# Patient Record
Sex: Male | Born: 1985 | Race: White | Hispanic: No | Marital: Single | Smoking: Never smoker
Health system: Southern US, Community
[De-identification: ages and names within clinical notes are randomized; demographics above are authoritative.]

## PROBLEM LIST (undated history)

## (undated) DIAGNOSIS — N200 Calculus of kidney: Secondary | ICD-10-CM

---
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
## 2011-09-15 ENCOUNTER — Emergency Department (HOSPITAL_COMMUNITY)
Admission: EM | Admit: 2011-09-15 | Discharge: 2011-09-15 | Disposition: A | Discharge status: Home / Self Care | Payer: Self-pay | Attending: Emergency Medicine | Admitting: Emergency Medicine

## 2011-09-15 ENCOUNTER — Emergency Department (HOSPITAL_COMMUNITY): Payer: Self-pay

## 2011-09-15 DIAGNOSIS — R112 Nausea with vomiting, unspecified: Secondary | ICD-10-CM | POA: Insufficient documentation

## 2011-09-15 DIAGNOSIS — R109 Unspecified abdominal pain: Secondary | ICD-10-CM | POA: Insufficient documentation

## 2011-09-15 DIAGNOSIS — N200 Calculus of kidney: Secondary | ICD-10-CM | POA: Insufficient documentation

## 2011-09-15 LAB — URINALYSIS, ROUTINE W REFLEX MICROSCOPIC
Bilirubin Urine: NEGATIVE
Ketones, ur: NEGATIVE mg/dL
Nitrite: NEGATIVE
Urobilinogen, UA: 0.2 mg/dL (ref 0.0–1.0)
pH: 7.5 (ref 5.0–8.0)

## 2011-09-15 LAB — POCT I-STAT, CHEM 8
BUN: 11 mg/dL (ref 6–23)
Creatinine, Ser: 1 mg/dL (ref 0.50–1.35)
Sodium: 141 mEq/L (ref 135–145)
TCO2: 25 mmol/L (ref 0–100)

## 2011-09-16 LAB — URINE CULTURE
Colony Count: NO GROWTH
Culture: NO GROWTH

## 2012-04-19 ENCOUNTER — Other Ambulatory Visit: Payer: Self-pay | Admitting: Urology

## 2012-04-19 DIAGNOSIS — N200 Calculus of kidney: Secondary | ICD-10-CM

## 2012-04-24 ENCOUNTER — Other Ambulatory Visit: Payer: Self-pay | Admitting: Radiology

## 2012-04-25 ENCOUNTER — Encounter (HOSPITAL_COMMUNITY): Payer: Self-pay | Admitting: Pharmacy Technician

## 2012-04-30 ENCOUNTER — Encounter (HOSPITAL_COMMUNITY)
Admission: RE | Admit: 2012-04-30 | Discharge: 2012-04-30 | Disposition: A | Discharge status: Home / Self Care | Payer: Self-pay | Source: Ambulatory Visit | Attending: Urology | Admitting: Urology

## 2012-04-30 ENCOUNTER — Encounter (HOSPITAL_COMMUNITY): Payer: Self-pay

## 2012-04-30 HISTORY — DX: Calculus of kidney: N20.0

## 2012-04-30 LAB — CBC
HCT: 43.6 % (ref 39.0–52.0)
Hemoglobin: 14.6 g/dL (ref 13.0–17.0)
WBC: 4.1 10*3/uL (ref 4.0–10.5)

## 2012-04-30 LAB — BASIC METABOLIC PANEL
CO2: 29 mEq/L (ref 19–32)
Chloride: 101 mEq/L (ref 96–112)
Potassium: 3.6 mEq/L (ref 3.5–5.1)
Sodium: 138 mEq/L (ref 135–145)

## 2012-04-30 LAB — URINE MICROSCOPIC-ADD ON

## 2012-04-30 LAB — URINALYSIS, ROUTINE W REFLEX MICROSCOPIC
Bilirubin Urine: NEGATIVE
Ketones, ur: NEGATIVE mg/dL
Nitrite: NEGATIVE
Protein, ur: 100 mg/dL — AB
Specific Gravity, Urine: 1.023 (ref 1.005–1.030)
Urobilinogen, UA: 0.2 mg/dL (ref 0.0–1.0)

## 2012-04-30 LAB — SURGICAL PCR SCREEN
MRSA, PCR: NEGATIVE
Staphylococcus aureus: POSITIVE — AB

## 2012-04-30 LAB — APTT: aPTT: 31 seconds (ref 24–37)

## 2012-04-30 LAB — PROTIME-INR: INR: 1 (ref 0.00–1.49)

## 2012-04-30 NOTE — Patient Instructions (Addendum)
[address]  04/30/2012   Your procedure is scheduled on:05-07-2012    Report to [facility]iology at  [facility] AM.  Call this number if you have problems the morning of surgery: [phone]   Remember:   Do not eat food or drink liquids:After Midnight.  .  Take these medicines the morning of surgery with A SIP OF WATER: claritin, hydrocodone if needed.   Do not wear jewelry or make up.  Do not wear lotions, powders, or perfumes.Do not wear deodorant.    Do not bring valuables to the hospital.  Contacts, dentures or bridgework may not be worn into surgery.  Leave suitcase in the car. After surgery it may be brought to your room.  For patients admitted to the hospital, checkout time is 11:00 AM the day of discharge.     Special Instructions: CHG Shower Use Special Wash: 1/2 bottle night before surgery and 1/2 bottle morning of surgery.neck down avoid private area   Please read over the following fact sheets that you were given: MRSA Information  [facility] pre op nurse phone number [phone], call if needed

## 2012-05-01 ENCOUNTER — Encounter (HOSPITAL_COMMUNITY): Payer: Self-pay | Admitting: Pharmacy Technician

## 2012-05-03 ENCOUNTER — Telehealth (HOSPITAL_COMMUNITY): Payer: Self-pay | Admitting: Urology

## 2012-05-03 ENCOUNTER — Other Ambulatory Visit: Payer: Self-pay | Admitting: Physician Assistant

## 2012-05-03 LAB — URINE CULTURE

## 2012-05-03 NOTE — Pre-Procedure Instructions (Addendum)
Faxed urine culture, urine micro and ua results to dr Brunilda Payor fax concifrmation received and placed on chart, left message with selita brasher also  Concerning results faxed to dr Brunilda Payor.

## 2012-05-06 ENCOUNTER — Other Ambulatory Visit: Payer: Self-pay | Admitting: Radiology

## 2012-05-07 ENCOUNTER — Ambulatory Visit (HOSPITAL_COMMUNITY): Payer: Self-pay

## 2012-05-07 ENCOUNTER — Ambulatory Visit (HOSPITAL_COMMUNITY): Payer: Self-pay | Admitting: Anesthesiology

## 2012-05-07 ENCOUNTER — Ambulatory Visit (HOSPITAL_COMMUNITY)
Admission: RE | Admit: 2012-05-07 | Discharge: 2012-05-08 | Disposition: A | Discharge status: Home / Self Care | Payer: Self-pay | Source: Ambulatory Visit | Attending: Urology | Admitting: Urology

## 2012-05-07 ENCOUNTER — Encounter (HOSPITAL_COMMUNITY)
Admission: RE | Disposition: A | Discharge status: Home / Self Care | Payer: Self-pay | Source: Ambulatory Visit | Attending: Urology

## 2012-05-07 ENCOUNTER — Ambulatory Visit (HOSPITAL_COMMUNITY)
Admission: RE | Admit: 2012-05-07 | Discharge: 2012-05-07 | Disposition: A | Discharge status: Home / Self Care | Payer: Self-pay | Source: Ambulatory Visit | Attending: Urology | Admitting: Urology

## 2012-05-07 ENCOUNTER — Encounter (HOSPITAL_COMMUNITY): Payer: Self-pay | Admitting: Anesthesiology

## 2012-05-07 ENCOUNTER — Encounter (HOSPITAL_COMMUNITY): Payer: Self-pay | Admitting: Urology

## 2012-05-07 DIAGNOSIS — N200 Calculus of kidney: Secondary | ICD-10-CM

## 2012-05-07 DIAGNOSIS — R109 Unspecified abdominal pain: Secondary | ICD-10-CM | POA: Insufficient documentation

## 2012-05-07 DIAGNOSIS — N1330 Unspecified hydronephrosis: Secondary | ICD-10-CM | POA: Insufficient documentation

## 2012-05-07 DIAGNOSIS — Z01812 Encounter for preprocedural laboratory examination: Secondary | ICD-10-CM | POA: Insufficient documentation

## 2012-05-07 HISTORY — PX: NEPHROLITHOTOMY: SHX5134

## 2012-05-07 SURGERY — NEPHROLITHOTOMY PERCUTANEOUS
Anesthesia: General | Laterality: Left | Wound class: Clean Contaminated

## 2012-05-07 MED ORDER — IOHEXOL 300 MG/ML  SOLN
INTRAMUSCULAR | Status: AC
  Filled 2012-05-07: qty 2

## 2012-05-07 MED ORDER — FENTANYL CITRATE 0.05 MG/ML IJ SOLN
INTRAMUSCULAR | Status: AC | PRN
  Administered 2012-05-07 (×3): 50 ug via INTRAVENOUS

## 2012-05-07 MED ORDER — PROPOFOL 10 MG/ML IV EMUL
INTRAVENOUS | Status: DC | PRN
  Administered 2012-05-07: 150 mg via INTRAVENOUS

## 2012-05-07 MED ORDER — SODIUM CHLORIDE 0.9 % IV BOLUS (SEPSIS)
500.0000 mL | Freq: Once | INTRAVENOUS | Status: AC
  Administered 2012-05-07: 500 mL via INTRAVENOUS

## 2012-05-07 MED ORDER — MIDAZOLAM HCL 5 MG/5ML IJ SOLN
INTRAMUSCULAR | Status: AC | PRN
  Administered 2012-05-07 (×3): 2 mg via INTRAVENOUS

## 2012-05-07 MED ORDER — SODIUM CHLORIDE 0.9 % IV SOLN: INTRAVENOUS | Status: DC

## 2012-05-07 MED ORDER — FENTANYL CITRATE 0.05 MG/ML IJ SOLN
INTRAMUSCULAR | Status: AC
  Filled 2012-05-07: qty 2

## 2012-05-07 MED ORDER — SUFENTANIL CITRATE 50 MCG/ML IV SOLN
INTRAVENOUS | Status: DC | PRN
  Administered 2012-05-07 (×5): 10 ug via INTRAVENOUS

## 2012-05-07 MED ORDER — SODIUM CHLORIDE 0.9 % IR SOLN
Status: DC | PRN
  Administered 2012-05-07: 15000 mL

## 2012-05-07 MED ORDER — LACTATED RINGERS IV SOLN
INTRAVENOUS | Status: DC | PRN
  Administered 2012-05-07 (×2): via INTRAVENOUS

## 2012-05-07 MED ORDER — DEXAMETHASONE SODIUM PHOSPHATE 4 MG/ML IJ SOLN
INTRAMUSCULAR | Status: DC | PRN
  Administered 2012-05-07: 10 mg via INTRAVENOUS

## 2012-05-07 MED ORDER — HYDROCODONE-ACETAMINOPHEN 5-325 MG PO TABS
1.0000 | ORAL_TABLET | ORAL | Status: DC | PRN
  Administered 2012-05-07 – 2012-05-08 (×2): 2 via ORAL
  Filled 2012-05-07 (×2): qty 2

## 2012-05-07 MED ORDER — ZOLPIDEM TARTRATE 5 MG PO TABS: 5.0000 mg | ORAL_TABLET | Freq: Every evening | ORAL | Status: DC | PRN

## 2012-05-07 MED ORDER — LACTATED RINGERS IV SOLN: INTRAVENOUS | Status: DC

## 2012-05-07 MED ORDER — LIDOCAINE HCL (CARDIAC) 20 MG/ML IV SOLN
INTRAVENOUS | Status: DC | PRN
  Administered 2012-05-07: 100 mg via INTRAVENOUS

## 2012-05-07 MED ORDER — MIDAZOLAM HCL 2 MG/2ML IJ SOLN
INTRAMUSCULAR | Status: AC
  Filled 2012-05-07: qty 6

## 2012-05-07 MED ORDER — NEOSTIGMINE METHYLSULFATE 1 MG/ML IJ SOLN
INTRAMUSCULAR | Status: DC | PRN
  Administered 2012-05-07: 4 mg via INTRAVENOUS

## 2012-05-07 MED ORDER — ONDANSETRON HCL 4 MG/2ML IJ SOLN
4.0000 mg | INTRAMUSCULAR | Status: DC | PRN
  Administered 2012-05-07 – 2012-05-08 (×2): 4 mg via INTRAVENOUS
  Filled 2012-05-07 (×2): qty 2

## 2012-05-07 MED ORDER — CIPROFLOXACIN IN D5W 400 MG/200ML IV SOLN
400.0000 mg | INTRAVENOUS | Status: AC
  Administered 2012-05-07: 400 mg via INTRAVENOUS
  Filled 2012-05-07: qty 200

## 2012-05-07 MED ORDER — CEFAZOLIN SODIUM 1-5 GM-% IV SOLN
1.0000 g | INTRAVENOUS | Status: DC
  Filled 2012-05-07: qty 50

## 2012-05-07 MED ORDER — CIPROFLOXACIN IN D5W 400 MG/200ML IV SOLN
400.0000 mg | Freq: Two times a day (BID) | INTRAVENOUS | Status: DC
  Administered 2012-05-07 – 2012-05-08 (×2): 400 mg via INTRAVENOUS
  Filled 2012-05-07 (×3): qty 200

## 2012-05-07 MED ORDER — HYDROMORPHONE HCL PF 1 MG/ML IJ SOLN: 0.2500 mg | INTRAMUSCULAR | Status: DC | PRN

## 2012-05-07 MED ORDER — HYDROMORPHONE HCL PF 1 MG/ML IJ SOLN
0.5000 mg | INTRAMUSCULAR | Status: DC | PRN
  Administered 2012-05-07: 1 mg via INTRAVENOUS
  Administered 2012-05-07: 0.5 mg via INTRAVENOUS
  Administered 2012-05-07 – 2012-05-08 (×2): 1 mg via INTRAVENOUS
  Filled 2012-05-07 (×4): qty 1

## 2012-05-07 MED ORDER — MEPERIDINE HCL 50 MG/ML IJ SOLN: 6.2500 mg | INTRAMUSCULAR | Status: DC | PRN

## 2012-05-07 MED ORDER — PROMETHAZINE HCL 25 MG/ML IJ SOLN: 6.2500 mg | INTRAMUSCULAR | Status: DC | PRN

## 2012-05-07 MED ORDER — KETOROLAC TROMETHAMINE 30 MG/ML IJ SOLN
INTRAMUSCULAR | Status: DC | PRN
  Administered 2012-05-07: 30 mg via INTRAVENOUS

## 2012-05-07 MED ORDER — IOHEXOL 300 MG/ML  SOLN
INTRAMUSCULAR | Status: DC | PRN
  Administered 2012-05-07: 50 mL

## 2012-05-07 MED ORDER — LIDOCAINE HCL 1 % IJ SOLN
INTRAMUSCULAR | Status: AC
  Filled 2012-05-07: qty 20

## 2012-05-07 MED ORDER — GLYCOPYRROLATE 0.2 MG/ML IJ SOLN
INTRAMUSCULAR | Status: DC | PRN
  Administered 2012-05-07: .6 mg via INTRAVENOUS

## 2012-05-07 MED ORDER — CISATRACURIUM BESYLATE (PF) 10 MG/5ML IV SOLN
INTRAVENOUS | Status: DC | PRN
  Administered 2012-05-07 (×2): 2 mg via INTRAVENOUS
  Administered 2012-05-07: 6 mg via INTRAVENOUS

## 2012-05-07 MED ORDER — SUCCINYLCHOLINE CHLORIDE 20 MG/ML IJ SOLN
INTRAMUSCULAR | Status: DC | PRN
  Administered 2012-05-07: 80 mg via INTRAVENOUS

## 2012-05-07 MED ORDER — IOHEXOL 300 MG/ML  SOLN: 50.0000 mL | Freq: Once | INTRAMUSCULAR | Status: DC | PRN

## 2012-05-07 MED ORDER — ONDANSETRON HCL 4 MG/2ML IJ SOLN
INTRAMUSCULAR | Status: DC | PRN
  Administered 2012-05-07: 4 mg via INTRAVENOUS

## 2012-05-07 MED ORDER — FENTANYL CITRATE 0.05 MG/ML IJ SOLN
INTRAMUSCULAR | Status: DC | PRN
  Administered 2012-05-07 (×4): 25 ug via INTRAVENOUS

## 2012-05-07 SURGICAL SUPPLY — 44 items
BAG URINE DRAINAGE (UROLOGICAL SUPPLIES) ×2 IMPLANT
BASKET ZERO TIP NITINOL 2.4FR (BASKET) ×2 IMPLANT
BENZOIN TINCTURE PRP APPL 2/3 (GAUZE/BANDAGES/DRESSINGS) ×2 IMPLANT
BLADE SURG 15 STRL LF DISP TIS (BLADE) ×1 IMPLANT
BLADE SURG 15 STRL SS (BLADE) ×1
CATH COUNCIL 22FR (CATHETERS) ×2 IMPLANT
CATH FOLEY 2W COUNCIL 20FR 5CC (CATHETERS) IMPLANT
CATH FOLEY 2WAY SLVR  5CC 16FR (CATHETERS) ×1
CATH FOLEY 2WAY SLVR 5CC 16FR (CATHETERS) ×1 IMPLANT
CATH ROBINSON RED A/P 20FR (CATHETERS) IMPLANT
CATH X-FORCE N30 NEPHROSTOMY (TUBING) ×2 IMPLANT
CLOTH BEACON ORANGE TIMEOUT ST (SAFETY) ×2 IMPLANT
COVER SURGICAL LIGHT HANDLE (MISCELLANEOUS) ×2 IMPLANT
DRAPE C-ARM 42X72 X-RAY (DRAPES) ×2 IMPLANT
DRAPE CAMERA CLOSED 9X96 (DRAPES) ×2 IMPLANT
DRAPE LINGEMAN PERC (DRAPES) ×2 IMPLANT
DRAPE SURG IRRIG POUCH 19X23 (DRAPES) ×2 IMPLANT
DRSG TEGADERM 8X12 (GAUZE/BANDAGES/DRESSINGS) IMPLANT
GAUZE SPONGE 4X4 16PLY XRAY LF (GAUZE/BANDAGES/DRESSINGS) ×2 IMPLANT
GLOVE BIOGEL M 7.0 STRL (GLOVE) ×2 IMPLANT
GOWN STRL NON-REIN LRG LVL3 (GOWN DISPOSABLE) IMPLANT
KIT BASIN OR (CUSTOM PROCEDURE TRAY) ×2 IMPLANT
LASER FIBER DISP (UROLOGICAL SUPPLIES) IMPLANT
LASER FIBER DISP 1000U (UROLOGICAL SUPPLIES) IMPLANT
MANIFOLD NEPTUNE II (INSTRUMENTS) ×2 IMPLANT
NS IRRIG 1000ML POUR BTL (IV SOLUTION) ×2 IMPLANT
PACK BASIC VI WITH GOWN DISP (CUSTOM PROCEDURE TRAY) ×2 IMPLANT
POSITIONER SURGICAL ARM (MISCELLANEOUS) IMPLANT
PROBE EHL 9 FR 470CM (MISCELLANEOUS) IMPLANT
PROBE LITHOCLAST ULTRA 3.8X403 (UROLOGICAL SUPPLIES) ×2 IMPLANT
PROBE PNEUMATIC 1.0MMX570MM (UROLOGICAL SUPPLIES) ×2 IMPLANT
SET IRRIG Y TYPE TUR BLADDER L (SET/KITS/TRAYS/PACK) ×2 IMPLANT
SET WARMING FLUID IRRIGATION (MISCELLANEOUS) IMPLANT
SPONGE GAUZE 4X4 12PLY (GAUZE/BANDAGES/DRESSINGS) IMPLANT
SPONGE LAP 4X18 X RAY DECT (DISPOSABLE) ×2 IMPLANT
STONE CATCHER W/TUBE ADAPTER (UROLOGICAL SUPPLIES) ×2 IMPLANT
SUT SILK 2 0 30  PSL (SUTURE) ×1
SUT SILK 2 0 30 PSL (SUTURE) ×1 IMPLANT
SYR 20CC LL (SYRINGE) ×4 IMPLANT
SYRINGE 10CC LL (SYRINGE) ×2 IMPLANT
TOWEL OR NON WOVEN STRL DISP B (DISPOSABLE) ×2 IMPLANT
TRAY FOLEY BAG SILVER LF 14FR (CATHETERS) IMPLANT
TRAY FOLEY CATH 14FRSI W/METER (CATHETERS) ×2 IMPLANT
TUBING CONNECTING 10 (TUBING) ×4 IMPLANT

## 2012-05-07 NOTE — Op Note (Signed)
Jamarri Vuncannon is a 26 y.o.   05/07/2012  Preop diagnosis: Left renal pelvis calculus  Postop diagnosis: Same  Procedure done: Left percutaneous nephrolithotomy  Surgeon: Wendie Simmer. Orlyn Odonoghue and Dr Jolaine Click  Anesthesia: General  Indication: Patient is a 26 years old male with a history of kidney stone. He had left-sided abdominal pain in October 2012. He was seen in the emergency room at that time and a CT scan revealed an 11 x 16 mm stone in the renal pelvis with mild to moderate hydronephrosis. He also has nonobstructing renal calculi on the right side. Treatment options were discussed with him: ESL versus PCNL. The risks and benefits of each procedure were explained to him in detail. He chose to have PCNL. He is scheduled today for the procedure.  Procedure: Patient was identified by his wrist band and proper timeout was taken.  Under general anesthesia the patient was prepped and draped and placed in the prone position. The patient had the percutaneous nephrostomy done earlier today by Dr. Bonnielee Haff. A skin incision was then made to allow passage of the balloon dilator and the Amplatz sheath. The nephrostomy track was then dilated by Dr. Bonnielee Haff. A working guidewire was in place as well as a Museum/gallery conservator. The nephroscope was then passed through the Amplatz sheath. The stone was then identified and with the lithoclast was fragmented. The small fragments were suctioned out. A larger fragment migrated in the upper pole calyx. It was difficult to maneuver the rigid scope into the upper pole calyx. After several attempts the rigid scope was removed. A flexible cystoscope was then passed through the Amplatz sheath and I was able to maneuver the scope into the upper pole calyx. The stone was visualized and caught within the wires of a Nitinol basket. The flexible cystoscope with the basket and the Amplatz sheath were removed and the stone was extracted. Under fluoroscopy there was no evidence of remaining stone  fragments in the kidney. A #22 French Councill catheter was passed over the working wire. The balloon of the catheter was then inflated with 4 cc of water. Contrast was then injected through the catheter and the tip of the catheter is in the renal pelvis. A nephro ureteral catheter was passed over the safety wire the tip of the catheter is in the bladder. The Amplatz sheath was then removed. The skin incision was then closed with #2-0 silk and then the catheters were secured to the skin   EBL: 50 cc  Needles, sponges count: Correct.  The patient tolerated the procedure well and left the OR in satisfactory condition to postanesthesia care unit.

## 2012-05-07 NOTE — Preoperative (Signed)
Beta Blockers   Reason not to administer Beta Blockers:Not Applicable 

## 2012-05-07 NOTE — ED Notes (Signed)
Incision made

## 2012-05-07 NOTE — ED Notes (Signed)
To OR holding report given

## 2012-05-07 NOTE — Progress Notes (Signed)
Afebrile.  V/S stable. Has mild left sided flank pain.   Foley is draining slightly bloody urine. Nephrostomy catheter draining small amount of bloody urine. Abdomen soft non distended. Plan: Remove Foley in AM.          CT scan stone protocol in AM          Probable discharge in AM.

## 2012-05-07 NOTE — Transfer of Care (Signed)
Immediate Anesthesia Transfer of Care Note  Patient: Darryl Chavez  Procedure(s) Performed: Procedure(s) (LRB): NEPHROLITHOTOMY PERCUTANEOUS (Left)  Patient Location: PACU  Anesthesia Type: General  Level of Consciousness: awake, alert  and oriented  Airway & Oxygen Therapy: Patient Spontanous Breathing and Patient connected to nasal cannula oxygen  Post-op Assessment: Report given to PACU RN and Post -op Vital signs reviewed and stable  Post vital signs: Reviewed and stable  Complications: No apparent anesthesia complications

## 2012-05-07 NOTE — Procedures (Signed)
L PCN 5 fr No comp 

## 2012-05-07 NOTE — Anesthesia Postprocedure Evaluation (Signed)
  Anesthesia Post-op Note  Patient: Darryl Chavez  Procedure(s) Performed: Procedure(s) (LRB): NEPHROLITHOTOMY PERCUTANEOUS (Left)  Patient Location: PACU  Anesthesia Type: General  Level of Consciousness: awake and alert   Airway and Oxygen Therapy: Patient Spontanous Breathing  Post-op Pain: mild  Post-op Assessment: Post-op Vital signs reviewed, Patient's Cardiovascular Status Stable, Respiratory Function Stable, Patent Airway and No signs of Nausea or vomiting  Post-op Vital Signs: stable  Complications: No apparent anesthesia complications

## 2012-05-07 NOTE — Anesthesia Preprocedure Evaluation (Addendum)

## 2012-05-07 NOTE — ED Notes (Signed)
Procedure ends 

## 2012-05-07 NOTE — H&P (Signed)
Darryl Chavez is an 26 y.o. male.   Chief Complaint: left kidney stone [id number] with history of left renal calculus/left hydronephrosis presents today for left percutaneous nephrostomy prior to nephrolithotomy.  Past Medical History  Diagnosis Date  . Renal calculus     left pelvic    Past Surgical History  Procedure Date  . Lithotrispy 2007    No family history on file. Social History:  reports that he has never smoked. He has never used smokeless tobacco. He reports that he uses illicit drugs (Marijuana). He reports that he does not drink alcohol.  Allergies: No Known Allergies  Current outpatient prescriptions:HYDROcodone-acetaminophen (VICODIN) 5-500 MG per tablet, Take 1 tablet by mouth every 6 (six) hours as needed. For pain, Disp: , Rfl: ;  ibuprofen (ADVIL,MOTRIN) 200 MG tablet, Take 400 mg by mouth every 6 (six) hours as needed. For headache, Disp: , Rfl: ;  loratadine (CLARITIN) 10 MG tablet, Take 10 mg by mouth daily with breakfast., Disp: , Rfl:  Current facility-administered medications:0.9 %  sodium chloride infusion, , Intravenous, Continuous, D Kevin Shirley Decamp, PA;  ciprofloxacin (CIPRO) IVPB 400 mg, 400 mg, Intravenous, On Call, D Jeananne Rama, PA  Results for orders placed during the hospital encounter of 04/30/12  URINALYSIS, ROUTINE W REFLEX MICROSCOPIC      Component Value Range   Color, Urine YELLOW  YELLOW    APPearance CLOUDY (*) CLEAR    Specific Gravity, Urine 1.023  1.005 - 1.030    pH 5.5  5.0 - 8.0    Glucose, UA NEGATIVE  NEGATIVE (mg/dL)   Hgb urine dipstick LARGE (*) NEGATIVE    Bilirubin Urine NEGATIVE  NEGATIVE    Ketones, ur NEGATIVE  NEGATIVE (mg/dL)   Protein, ur 098 (*) NEGATIVE (mg/dL)   Urobilinogen, UA 0.2  0.0 - 1.0 (mg/dL)   Nitrite NEGATIVE  NEGATIVE    Leukocytes, UA MODERATE (*) NEGATIVE   URINE CULTURE      Component Value Range   Specimen Description URINE, CLEAN CATCH     Special Requests NONE     Culture  Setup Time  [id number]     Colony Count 10,000 COLONIES/ML     Culture       Value: GROUP B STREP(S.AGALACTIAE)ISOLATED     Note: TESTING AGAINST S. AGALACTIAE NOT ROUTINELY PERFORMED DUE TO PREDICTABILITY OF AMP/PEN/VAN SUSCEPTIBILITY.   Report Status 05/03/2012 FINAL    BASIC METABOLIC PANEL      Component Value Range   Sodium 138  135 - 145 (mEq/L)   Potassium 3.6  3.5 - 5.1 (mEq/L)   Chloride 101  96 - 112 (mEq/L)   CO2 29  19 - 32 (mEq/L)   Glucose, Bld 76  70 - 99 (mg/dL)   BUN 13  6 - 23 (mg/dL)   Creatinine, Ser 1.30  0.50 - 1.35 (mg/dL)   Calcium 9.6  8.4 - 86.5 (mg/dL)   GFR calc non Af Amer >90  >90 (mL/min)   GFR calc Af Amer >90  >90 (mL/min)  APTT      Component Value Range   aPTT 31  24 - 37 (seconds)  CBC      Component Value Range   WBC 4.1  4.0 - 10.5 (K/uL)   RBC 4.91  4.22 - 5.81 (MIL/uL)   Hemoglobin 14.6  13.0 - 17.0 (g/dL)   HCT 78.4  69.6 - 29.5 (%)   MCV 88.8  78.0 - 100.0 (fL)   MCH 29.7  26.0 -  34.0 (pg)   MCHC 33.5  30.0 - 36.0 (g/dL)   RDW 09.8  11.9 - 14.7 (%)   Platelets 173  150 - 400 (K/uL)  PROTIME-INR      Component Value Range   Prothrombin Time 13.4  11.6 - 15.2 (seconds)   INR 1.00  0.00 - 1.49   URINE MICROSCOPIC-ADD ON      Component Value Range   Squamous Epithelial / LPF FEW (*) RARE    WBC, UA 11-20  <3 (WBC/hpf)   RBC / HPF 11-20  <3 (RBC/hpf)   Bacteria, UA FEW (*) RARE    Urine-Other MUCOUS PRESENT    SURGICAL PCR SCREEN      Component Value Range   MRSA, PCR NEGATIVE  NEGATIVE    Staphylococcus aureus POSITIVE (*) NEGATIVE     Review of Systems  Constitutional: Negative for fever and chills.  Respiratory: Negative for cough and shortness of breath.   Cardiovascular: Negative for chest pain.  Gastrointestinal: Negative for nausea, vomiting and abdominal pain.  Genitourinary: Negative for hematuria.  Musculoskeletal: Negative for back pain.  Neurological: Negative for headaches.  Endo/Heme/Allergies: Does not bruise/bleed  easily.   Filed Vitals:   05/07/12 0700  Temp: 97.8 F (36.6 C)   Vitals:   BP 125/73  HR  83  O2 SATS 98% RA Physical Exam  Constitutional: He is oriented to person, place, and time. He appears well-developed and well-nourished.  Cardiovascular: Normal rate and regular rhythm.   Respiratory: Effort normal and breath sounds normal.  GI: Soft. Bowel sounds are normal.  Musculoskeletal: Normal range of motion. He exhibits no edema.  Neurological: He is alert and oriented to person, place, and time.     Assessment/Plan Patient with left renal calculus/left hydronephrosis; plan is for left percutaneous nephrostomy prior to nephrolithotomy. Details/risks of procedure d/w pt with his understanding and consent.  Darryl Chavez,D KEVIN 05/07/2012, 8:01 AM

## 2012-05-07 NOTE — H&P (Signed)
History of Present Illness  [name] presents today with a history of left flank pain on and off for several months.  The pain is severe at times and associated with nausea.  He had ESL in 2007 or 2008 in [place].  He does not remember which side. He was seen in the ER at [facility] in October 2012 and was found to have an 11x 16 mm stone in the leftrenal pelvis with moderate hydronephrosis.  He did not seek urologic evaluation.  He does not have frequency, urgency, dysuria or hematuria.   Past Medical History Problems  1. History of  Nephrolithiasis V13.01  Surgical History Problems  1. History of  Lithotripsy  Current Meds 1. No Reported Medications  Allergies Medication  1. No Known Drug Allergies  Family History Problems  1. Family history of  Family Health Status - Father's Age 44 2. Family history of  Family Health Status - Mother's Age 61 3. 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  ENT: sinus problems.    Vitals Vital Signs [Data Includes: Last 1 Day]  09May2013 02:13PM  BMI Calculated: 19.5 BSA Calculated: 1.85 Height: 6 ft  Weight: 144 lb  Blood Pressure: 127 / 81 Temperature: 99.1 F Heart Rate: 88 Respiration: 18  Physical Exam Constitutional: Well nourished and well developed . No acute distress.  ENT:. The ears and nose are normal in appearance.  Neck: The appearance of the neck is normal and no neck mass is present.  Pulmonary: No respiratory distress and normal respiratory rhythm and effort.  Cardiovascular: Heart rate and  rhythm are normal . No peripheral edema.  Abdomen: The abdomen is soft and nontender. No masses are palpated. No CVA tenderness. No hernias are palpable. No hepatosplenomegaly noted.  Genitourinary: Examination of the penis demonstrates a normal meatus. The penis is circumcised. The scrotum is normal in appearance. Examination of the right scrotum demonstrates no hydrocele. Examination of the left scrotum demostrates no hydrocele. The right epididymis is palpably normal. The left epididymis is palpably normal. The right spermatic cord is palpably normal. The left spermatic cord is palpably normal. The right testis is palpably normal. The left testis is normal.  Lymphatics: The femoral and inguinal nodes are not enlarged or tender.  Skin: Normal skin turgor, no visible rash and no visible skin lesions.  Neuro/Psych:. Mood and affect are appropriate.    Results/Data Urine [Data Includes: Last 1 Day]   09May2013  COLOR YELLOW   APPEARANCE CLEAR   SPECIFIC GRAVITY 1.025   pH 6.0   GLUCOSE NEG mg/dL  BILIRUBIN NEG   KETONE NEG mg/dL  BLOOD LARGE   PROTEIN 100 mg/dL  UROBILINOGEN 0.2 mg/dL  NITRITE NEG   LEUKOCYTE ESTERASE SMALL   SQUAMOUS EPITHELIAL/HPF FEW   WBC 11-20 WBC/hpf  RBC TNTC RBC/hpf  BACTERIA NONE SEEN   CRYSTALS NONE SEEN   CASTS NONE SEEN     I have independently reviewed the CT scan of October 2012 and he has an 11x 16 mm stone in the left renal paelvis and 2-3 non obstructing right renal calculi..   Assessment Assessed  1. Nephrolithiasis  Of The Left Kidney 592.0 2. Nephrolithiasis Of The Right Kidney 592.0  Plan Health Maintenance (V70.0)  1. UA With REFLEX  Done: 09May2013 02:00PM Nephrolithiasis Of The Right Kidney (592.0)  2. Follow-up Schedule Surgery Office  Follow-up  Done: 09May2013   I discussed the treatment options with him: ureteroscopy with holmium laser, ESL or PCNL.  The risks, benefits of each option were explained to him.  He understands that ESL  is the least invasive of those options, but there are risks associated with it;  renal or perirenal hematoma, steinstrasse, inability to fragment the stone.  Ureteroscopy would be tedious because of the size of the stone.  He is interested in PCNL.  The risks of PCNL include but are not limited to hemorrhage, infection renal or ureteral injury, residual stone fragments requirind second look procedure.  He understands and wishes to proceed with ESL.   Signatures Electronically signed by : Su Grand, M.D.; Apr 18 2012  3:24PM

## 2012-05-07 NOTE — Anesthesia Procedure Notes (Signed)
Procedure Name: Intubation Date/Time: 05/07/2012 10:55 AM Performed by: Leroy Libman L Patient Re-evaluated:Patient Re-evaluated prior to inductionOxygen Delivery Method: Circle system utilized Preoxygenation: Pre-oxygenation with 100% oxygen Intubation Type: IV induction Ventilation: Mask ventilation without difficulty and Oral airway inserted - appropriate to patient size Laryngoscope Size: Miller and 3 Grade View: Grade I Tube type: Oral Tube size: 8.0 mm Number of attempts: 1 Airway Equipment and Method: Stylet Placement Confirmation: ETT inserted through vocal cords under direct vision,  breath sounds checked- equal and bilateral and positive ETCO2 Secured at: 22 cm Tube secured with: Tape Dental Injury: Teeth and Oropharynx as per pre-operative assessment

## 2012-05-08 ENCOUNTER — Ambulatory Visit (HOSPITAL_COMMUNITY): Payer: Self-pay

## 2012-05-08 ENCOUNTER — Encounter (HOSPITAL_COMMUNITY): Payer: Self-pay | Admitting: Urology

## 2012-05-08 LAB — BASIC METABOLIC PANEL
CO2: 25 mEq/L (ref 19–32)
Chloride: 100 mEq/L (ref 96–112)
GFR calc non Af Amer: 68 mL/min — ABNORMAL LOW (ref >90)
Glucose, Bld: 122 mg/dL — ABNORMAL HIGH (ref 70–99)
Potassium: 4.3 mEq/L (ref 3.5–5.1)
Sodium: 133 mEq/L — ABNORMAL LOW (ref 135–145)

## 2012-05-08 LAB — CBC
HCT: 37.1 % — ABNORMAL LOW (ref 39.0–52.0)
Hemoglobin: 12.7 g/dL — ABNORMAL LOW (ref 13.0–17.0)
MCH: 30.2 pg (ref 26.0–34.0)
RBC: 4.2 MIL/uL — ABNORMAL LOW (ref 4.22–5.81)

## 2012-05-08 MED ORDER — HYDROCODONE-ACETAMINOPHEN 5-500 MG PO TABS: 1.0000 | ORAL_TABLET | Freq: Four times a day (QID) | ORAL | Status: AC | PRN

## 2012-05-08 MED ORDER — CIPROFLOXACIN HCL 500 MG PO TABS: 500.0000 mg | ORAL_TABLET | Freq: Two times a day (BID) | ORAL | Status: AC

## 2012-05-08 NOTE — Progress Notes (Signed)
Foley d/c at 0400, pt tol. Well.-----Travius Crochet, D. rn

## 2012-05-08 NOTE — Discharge Summary (Signed)
Patient ID: Darryl Chavez MRN: [id number] DOB/AGE: 1986/06/13 26 y.o.  Admit date: 05/07/2012 Discharge date: 05/08/2012  Admission Diagnoses: LEFT RENAL PELVIS CALCULUS  Discharge Diagnoses: Same   Discharged Condition: Improved  Hospital Course: Had left PCNL on 5/28.  Post op course is uneventful.   Tolerates diet well.   Foley removed this morning.  Has voided X 2 since.   Nephrostomy catheter clamped. Wound clean. Abdomen soft, non distended. Had CT scan this morning to rule out residual stone fragments. Films not available yet.  Will review when scanned.    Significant Diagnostic Studies: Dg Abd 1 View  05/07/2012  *RADIOLOGY REPORT*  Clinical Data/Indication: LEFT RENAL PELVIC CALCULUS  INTRAOPERATIVE TRACT DILATATION AND STONE RETRIEVAL  Procedure: The procedure, risks, benefits, and alternatives were explained to the patient. Questions regarding the procedure were encouraged and answered. The patient understands and consents to the procedure.  General anesthesia was administered.  The patient was placed prone. The left back was prepped and draped in a sterile fashion.  The 5-French Nephro ureteral catheter was removed over an Amplatz. A 9-French sheath was inserted and removed over two Amplatz wires.  The 30-French dilatation device was then advanced over one of the Amplatz wires.  The 30-French sheath was advanced over the balloon to the pelvis.  Stone retrieval was performed by Dr. Brunilda Payor.  Follow-up imaging demonstrates complete resolution of the left renal calculus.  A 22- [place] counsel catheter was advanced over one of the Amplatz wire to the renal pelvis and contrast was injected.  Was sewn in place.  Findings: Imaging demonstrates access into the left kidney of the Nephro ureteral access.  Final image demonstrates complete resolution of the renal calculus and placement of a 22-French nephrostomy.  Complications: None.  IMPRESSION: Successful percutaneous stone removal.  Original  Report Authenticated By: Donavan Burnet, M.D.   Dg C-arm 1-60 Min-no Report  05/07/2012  CLINICAL DATA: Left percutaneous Nephrolithotomy   C-ARM 1-60 MINUTES  Fluoroscopy was utilized by the requesting physician.  No radiographic  interpretation.     Ir [place] Abts Cath Perc Left  05/07/2012  *RADIOLOGY REPORT*  Clinical Data/Indication: LEFT RENAL PELVIC CALCULUS  IR INTRO URET CATH PERC*L*  Sedation: Versed six mg, Fentanyl 400 mg.  Total Moderate Sedation Time: 37 minutes.  Contrast Volume: 20 ml Omnipaque-300.  As antibiotic prophylaxis, Cipro 400 mg was ordered pre-procedure and administered intravenously within one hour of incision.  Fluoroscopy Time: 11.5 minutes.  Procedure: The procedure, risks, benefits, and alternatives were explained to the patient. Questions regarding the procedure were encouraged and answered. The patient understands and consents to the procedure.  The left back was prepped with betadine in a sterile fashion, and a sterile drape was applied covering the operative field. A sterile gown and sterile gloves were used for the procedure.  Under fluoroscopic guidance, a Chiba needle was inserted into the renal pelvis directed towards the large pelvic calculus.  Contrast and gas were injected.  A 21 gauge needle was inserted into the lower pole calix and removed over a 0018 wire which was upsized to a three J.  This was exchanged for a glide using a Kumpe catheter.  The Kumpe was advanced over a glide wire into the bladder.  It was sewn in place. Contrast was injected.  Findings: Imaging demonstrates access in the left kidney via lower pole calix.  Final image demonstrates a left lower pole access with a Kumpe catheter and its tip in the bladder.  Complications: None.  IMPRESSION: Successful 5-French left Nephro ureteral catheter for percutaneous OR lithotomy.  Original Report Authenticated By: Donavan Burnet, M.D.    Treatments: Left Percutaneous nephrostomy  Discharge Exam: Blood  pressure 119/73, pulse 65, temperature 98.1 F (36.7 C), temperature source Oral, resp. rate 18, height 6' (1.829 m), weight 150 lb (68.04 kg), SpO2 100.00%. Abdomen soft non distended.  Disposition: 01-Home or Self Care  Discharge Orders    Future Appointments: Provider: Department: Dept Phone: Center:   05/08/2012 7:05 PM [facility] [phone] [place]     Future Orders Please Complete By Expires   Stone analysis        Medication List  As of 05/08/2012  8:29 AM   TAKE these medications         ciprofloxacin 500 MG tablet   Commonly known as: CIPRO   Take 1 tablet (500 mg total) by mouth 2 (two) times daily.      HYDROcodone-acetaminophen 5-500 MG per tablet   Commonly known as: VICODIN   Take 1 tablet by mouth every 6 (six) hours as needed. For pain      HYDROcodone-acetaminophen 5-500 MG per tablet   Commonly known as: VICODIN   Take 1 tablet by mouth every 6 (six) hours as needed for pain.      ibuprofen 200 MG tablet   Commonly known as: ADVIL,MOTRIN   Take 400 mg by mouth every 6 (six) hours as needed. For headache      loratadine 10 MG tablet   Commonly known as: CLARITIN   Take 10 mg by mouth daily with breakfast.             Signed: Yashua Bracco-HENRY 05/08/2012, 8:29 AM

## 2012-05-09 LAB — STONE ANALYSIS

## 2013-05-30 NOTE — Telephone Encounter (Signed)
e

## 2014-05-05 ENCOUNTER — Encounter (HOSPITAL_COMMUNITY): Payer: Self-pay | Admitting: Emergency Medicine

## 2014-05-05 ENCOUNTER — Emergency Department (HOSPITAL_COMMUNITY): Payer: Self-pay

## 2014-05-05 ENCOUNTER — Emergency Department (HOSPITAL_COMMUNITY)
Admission: EM | Admit: 2014-05-05 | Discharge: 2014-05-05 | Disposition: A | Discharge status: Home / Self Care | Payer: Self-pay | Attending: Emergency Medicine | Admitting: Emergency Medicine

## 2014-05-05 ENCOUNTER — Other Ambulatory Visit: Payer: Self-pay | Admitting: Urology

## 2014-05-05 DIAGNOSIS — N201 Calculus of ureter: Secondary | ICD-10-CM | POA: Insufficient documentation

## 2014-05-05 DIAGNOSIS — R630 Anorexia: Secondary | ICD-10-CM | POA: Insufficient documentation

## 2014-05-05 DIAGNOSIS — N132 Hydronephrosis with renal and ureteral calculous obstruction: Secondary | ICD-10-CM

## 2014-05-05 DIAGNOSIS — Z79899 Other long term (current) drug therapy: Secondary | ICD-10-CM | POA: Insufficient documentation

## 2014-05-05 DIAGNOSIS — N1330 Unspecified hydronephrosis: Secondary | ICD-10-CM | POA: Insufficient documentation

## 2014-05-05 LAB — CBC WITH DIFFERENTIAL/PLATELET
BASOS ABS: 0 10*3/uL (ref 0.0–0.1)
BASOS PCT: 0 % (ref 0–1)
EOS ABS: 0.1 10*3/uL (ref 0.0–0.7)
Eosinophils Relative: 1 % (ref 0–5)
HEMATOCRIT: 39.7 % (ref 39.0–52.0)
Hemoglobin: 13.9 g/dL (ref 13.0–17.0)
Lymphocytes Relative: 24 % (ref 12–46)
Lymphs Abs: 1.6 10*3/uL (ref 0.7–4.0)
MCH: 31.4 pg (ref 26.0–34.0)
MCHC: 35 g/dL (ref 30.0–36.0)
MCV: 89.8 fL (ref 78.0–100.0)
MONO ABS: 0.5 10*3/uL (ref 0.1–1.0)
MONOS PCT: 7 % (ref 3–12)
NEUTROS ABS: 4.6 10*3/uL (ref 1.7–7.7)
Neutrophils Relative %: 68 % (ref 43–77)
Platelets: 164 10*3/uL (ref 150–400)
RBC: 4.42 MIL/uL (ref 4.22–5.81)
RDW: 12.8 % (ref 11.5–15.5)
WBC: 6.8 10*3/uL (ref 4.0–10.5)

## 2014-05-05 LAB — COMPREHENSIVE METABOLIC PANEL
ALBUMIN: 4.8 g/dL (ref 3.5–5.2)
ALT: 15 U/L (ref 0–53)
AST: 21 U/L (ref 0–37)
Alkaline Phosphatase: 49 U/L (ref 39–117)
BILIRUBIN TOTAL: 1.7 mg/dL — AB (ref 0.3–1.2)
BUN: 17 mg/dL (ref 6–23)
CALCIUM: 9.8 mg/dL (ref 8.4–10.5)
CHLORIDE: 101 meq/L (ref 96–112)
CO2: 26 mEq/L (ref 19–32)
CREATININE: 0.84 mg/dL (ref 0.50–1.35)
GFR calc Af Amer: >90 mL/min (ref >90)
GFR calc non Af Amer: >90 mL/min (ref >90)
Glucose, Bld: 101 mg/dL — ABNORMAL HIGH (ref 70–99)
Potassium: 3.7 mEq/L (ref 3.7–5.3)
Sodium: 140 mEq/L (ref 137–147)
TOTAL PROTEIN: 7.8 g/dL (ref 6.0–8.3)

## 2014-05-05 LAB — URINE MICROSCOPIC-ADD ON

## 2014-05-05 LAB — URINALYSIS, ROUTINE W REFLEX MICROSCOPIC
Bilirubin Urine: NEGATIVE
GLUCOSE, UA: NEGATIVE mg/dL
KETONES UR: NEGATIVE mg/dL
NITRITE: NEGATIVE
PH: 6 (ref 5.0–8.0)
Protein, ur: NEGATIVE mg/dL
SPECIFIC GRAVITY, URINE: 1.025 (ref 1.005–1.030)
Urobilinogen, UA: 0.2 mg/dL (ref 0.0–1.0)

## 2014-05-05 LAB — LIPASE, BLOOD: LIPASE: 40 U/L (ref 11–59)

## 2014-05-05 MED ORDER — OXYCODONE-ACETAMINOPHEN 5-325 MG PO TABS: 1.0000 | ORAL_TABLET | ORAL | Status: DC | PRN

## 2014-05-05 MED ORDER — HYDROMORPHONE HCL PF 1 MG/ML IJ SOLN
1.0000 mg | Freq: Once | INTRAMUSCULAR | Status: AC
  Administered 2014-05-05: 1 mg via INTRAVENOUS
  Filled 2014-05-05: qty 1

## 2014-05-05 MED ORDER — PROMETHAZINE HCL 25 MG PO TABS: 25.0000 mg | ORAL_TABLET | Freq: Four times a day (QID) | ORAL | Status: DC | PRN

## 2014-05-05 NOTE — Consult Note (Signed)
Urology Consult  Referring physician: Margarita Mail, PA Reason for referral: Right flank pain.  Right UPJ calculus  Chief Complaint: Right flank pain, nausea, vomiting.  History of Present Illness:  Patient is a 28 years old male who presented to the emergency room this morning with sudden onset of severe right flank pain associated with nausea and vomiting. He was doing well until about 3:00 this morning when he woke up with severe pain. The pain was in the right flank and radiated to the right lower quadrant. He has a past history of kidney stone and had a left percutaneous nephrolithotomy in May 2015. He had not had any pain with kidney stones since until this morning. CT scan showed a 10 mm stone at the right UPJ with minimal hydronephrosis. He also has bilateral nonobstructing renal calculi. He was managed with IV analgesics. He is currently asymptomatic. He doesn't have anymore nausea and vomiting. She voids well he doesn't have any frequency urgency dysuria or gross hematuria. Serum calcium is 9.8. Creatinine is 0.84. White count is 6.8. Urinalysis shows 7-10 RBCs, 0-2 WBCs.  Past Medical History  Diagnosis Date  . Renal calculus     left pelvic   Past Surgical History  Procedure Laterality Date  . Lithotrispy  2007  . Nephrolithotomy  05/07/2012    Procedure: NEPHROLITHOTOMY PERCUTANEOUS;  Surgeon: Hanley Ben, MD;  Location: [facility];  Service: Urology;  Laterality: Left;    Medications: Ibuprofen PRN. Allergies: No Known Allergies  History reviewed. No pertinent family history. Social History:  reports that he has never smoked. He has never used smokeless tobacco. He reports that he uses illicit drugs (Marijuana). He reports that he does not drink alcohol.  ROS: All systems are reviewed and negative except as noted.   Physical Exam:  Vital signs in last 24 hours: Temp:  [98.3 F (36.8 C)] 98.3 F (36.8 C) (05/26 0607) Pulse Rate:  [64-72] 64 (05/26 0854) Resp:   [16-18] 16 (05/26 0854) BP: (110-131)/(62-80) 110/62 mmHg (05/26 0854) SpO2:  [98 %-100 %] 98 % (05/26 0854) Weight:  [68.04 kg (150 lb)] 68.04 kg (150 lb) (05/26 0607) HEENT: Normal.  No thyromegaly.  No cervical adenopathy. Cardiovascular: Skin warm; not flushed Respiratory: Breaths quiet; no shortness of breath Abdomen: No masses. Soft, non distended. No CVA tenderness at this time. Neurological: Normal sensation to touch Musculoskeletal: Normal motor function arms and legs Lymphatics: No inguinal adenopathy Skin: No rashes Genitourinary:Penis and scrotal contents are within normal limits  Laboratory Data:  Results for orders placed during the hospital encounter of 05/05/14 (from the past 72 hour(s))  CBC WITH DIFFERENTIAL     Status: None   Collection Time    05/05/14  6:30 AM      Result Value Ref Range   WBC 6.8  4.0 - 10.5 K/uL   RBC 4.42  4.22 - 5.81 MIL/uL   Hemoglobin 13.9  13.0 - 17.0 g/dL   HCT 39.7  39.0 - 52.0 %   MCV 89.8  78.0 - 100.0 fL   MCH 31.4  26.0 - 34.0 pg   MCHC 35.0  30.0 - 36.0 g/dL   RDW 12.8  11.5 - 15.5 %   Platelets 164  150 - 400 K/uL   Neutrophils Relative % 68  43 - 77 %   Neutro Abs 4.6  1.7 - 7.7 K/uL   Lymphocytes Relative 24  12 - 46 %   Lymphs Abs 1.6  0.7 - 4.0 K/uL  Monocytes Relative 7  3 - 12 %   Monocytes Absolute 0.5  0.1 - 1.0 K/uL   Eosinophils Relative 1  0 - 5 %   Eosinophils Absolute 0.1  0.0 - 0.7 K/uL   Basophils Relative 0  0 - 1 %   Basophils Absolute 0.0  0.0 - 0.1 K/uL  COMPREHENSIVE METABOLIC PANEL     Status: Abnormal   Collection Time    05/05/14  6:30 AM      Result Value Ref Range   Sodium 140  137 - 147 mEq/L   Potassium 3.7  3.7 - 5.3 mEq/L   Chloride 101  96 - 112 mEq/L   CO2 26  19 - 32 mEq/L   Glucose, Bld 101 (*) 70 - 99 mg/dL   BUN 17  6 - 23 mg/dL   Creatinine, Ser 0.84  0.50 - 1.35 mg/dL   Calcium 9.8  8.4 - 10.5 mg/dL   Total Protein 7.8  6.0 - 8.3 g/dL   Albumin 4.8  3.5 - 5.2 g/dL   AST 21   0 - 37 U/L   ALT 15  0 - 53 U/L   Alkaline Phosphatase 49  39 - 117 U/L   Total Bilirubin 1.7 (*) 0.3 - 1.2 mg/dL   GFR calc non Af Amer >90  >90 mL/min   GFR calc Af Amer >90  >90 mL/min   Comment: (NOTE)     The eGFR has been calculated using the CKD EPI equation.     This calculation has not been validated in all clinical situations.     eGFR's persistently <90 mL/min signify possible Chronic Kidney     Disease.  LIPASE, BLOOD     Status: None   Collection Time    05/05/14  6:30 AM      Result Value Ref Range   Lipase 40  11 - 59 U/L  URINALYSIS, ROUTINE W REFLEX MICROSCOPIC     Status: Abnormal   Collection Time    05/05/14  9:36 AM      Result Value Ref Range   Color, Urine YELLOW  YELLOW   APPearance CLEAR  CLEAR   Specific Gravity, Urine 1.025  1.005 - 1.030   pH 6.0  5.0 - 8.0   Glucose, UA NEGATIVE  NEGATIVE mg/dL   Hgb urine dipstick MODERATE (*) NEGATIVE   Bilirubin Urine NEGATIVE  NEGATIVE   Ketones, ur NEGATIVE  NEGATIVE mg/dL   Protein, ur NEGATIVE  NEGATIVE mg/dL   Urobilinogen, UA 0.2  0.0 - 1.0 mg/dL   Nitrite NEGATIVE  NEGATIVE   Leukocytes, UA TRACE (*) NEGATIVE  URINE MICROSCOPIC-ADD ON     Status: None   Collection Time    05/05/14  9:36 AM      Result Value Ref Range   Squamous Epithelial / LPF RARE  RARE   WBC, UA 0-2  <3 WBC/hpf   RBC / HPF 7-10  <3 RBC/hpf   Urine-Other MUCOUS PRESENT     No results found for this or any previous visit (from the past 240 hour(s)). Creatinine:  Recent Labs  05/05/14 0630  CREATININE 0.84    Xrays: See report/chart   Impression/Assessment:  Right UPJ calculus.  Mild right hydronephrosis.  Bilateral renal calculi.   Plan:  Patient is now asymptomatic.  He can go home on oral analgesics.  Advised patient not to take NSAID's.  Will need PTH and serum uric acid.  Plan for ESL right UPJ  calculus as outpatient.  This was discussed with the patient.  He understands and is agreeable with the treatment  plan.  Charlene Brooke Caswell Alvillar 05/05/2014, 11:46 AM   CC: Margarita Mail, PA.

## 2014-05-05 NOTE — ED Provider Notes (Signed)
CSN: [id number]     Arrival date & time 05/05/14  0600 History   First MD Initiated Contact with Patient 05/05/14 [phone]     Chief Complaint  Patient presents with  . Flank Pain     (Consider location/radiation/quality/duration/timing/severity/associated sxs/prior Treatment) HPI Comments: Darryl Chavez is a(n) 28 y.o. male who presents for R flank pain which awoke him from sleep around 1 AM this morning. He has a history of 11x 16 mm stone in the leftrenal pelvis with moderate hydronephrosis requiring lithotripsy in 2013. C/O severe, constant, non-radiating R flank pain with crescendos of severe pain. He has associated, cold chills, Nausea and vomiting. Patient took 2 aleve and 2 ibuprofen but vomited the medication and found no relief. Denies urinary sxs. Denies fevers,  myalgias, arthralgias. Denies DOE, SOB, chest tightness or pressure, radiation to left arm, jaw or back, or diaphoresis.  Denies headaches, light headedness, weakness, visual disturbances. Denies abdominal pain, nausea, vomiting, diarrhea or constipation.    Patient is a 28 y.o. male presenting with flank pain. The history is provided by the patient and medical records. No language interpreter was used.  Flank Pain This is a new problem. The current episode started today. The problem occurs constantly. The problem has been gradually worsening. Associated symptoms include anorexia, chills, nausea and vomiting. Pertinent negatives include no abdominal pain, arthralgias, change in bowel habit, chest pain, congestion, coughing, diaphoresis, fatigue, fever, headaches, joint swelling, neck pain, numbness, rash, sore throat, swollen glands, urinary symptoms, vertigo, visual change or weakness. Nothing aggravates the symptoms. He has tried NSAIDs for the symptoms. The treatment provided no relief.    Past Medical History  Diagnosis Date  . Renal calculus     left pelvic   Past Surgical History  Procedure Laterality Date  .  Lithotrispy  2007  . Nephrolithotomy  05/07/2012    Procedure: NEPHROLITHOTOMY PERCUTANEOUS;  Surgeon: Lindaann SloughMarc-Henry Nesi, MD;  Location: [facility];  Service: Urology;  Laterality: Left;   History reviewed. No pertinent family history. History  Substance Use Topics  . Smoking status: Never Smoker   . Smokeless tobacco: Never Used  . Alcohol Use: No    Review of Systems  Constitutional: Positive for chills. Negative for fever, diaphoresis and fatigue.  HENT: Negative for congestion and sore throat.   Respiratory: Negative for cough.   Cardiovascular: Negative for chest pain.  Gastrointestinal: Positive for nausea, vomiting and anorexia. Negative for abdominal pain and change in bowel habit.  Genitourinary: Positive for flank pain. Negative for dysuria, hematuria, decreased urine volume, penile swelling, difficulty urinating and testicular pain.  Musculoskeletal: Negative for arthralgias, joint swelling and neck pain.  Skin: Negative for rash.  Neurological: Negative for vertigo, weakness, numbness and headaches.  All other systems reviewed and are negative.     Allergies  Review of patient's allergies indicates no known allergies.  Home Medications   Prior to Admission medications   Medication Sig Start Date End Date Taking? Authorizing Provider  HYDROcodone-acetaminophen (VICODIN) 5-500 MG per tablet Take 1 tablet by mouth every 6 (six) hours as needed. For pain    Historical Provider, MD  ibuprofen (ADVIL,MOTRIN) 200 MG tablet Take 400 mg by mouth every 6 (six) hours as needed. For headache    Historical Provider, MD  loratadine (CLARITIN) 10 MG tablet Take 10 mg by mouth daily with breakfast.    Historical Provider, MD   BP 131/80  Pulse 72  Temp(Src) 98.3 F (36.8 C) (Oral)  Resp 18  Ht 6' (  1.829 m)  Wt 150 lb (68.04 kg)  BMI 20.34 kg/m2  SpO2 100% Physical Exam  Nursing note and vitals reviewed. Constitutional: He is oriented to person, place, and time. He appears  well-developed and well-nourished. No distress.  Appears extremely uncomfortable.   HENT:  Head: Normocephalic and atraumatic.  Eyes: Conjunctivae are normal. No scleral icterus.  Neck: Normal range of motion. Neck supple.  Cardiovascular: Normal rate, regular rhythm and normal heart sounds.   Pulmonary/Chest: Effort normal and breath sounds normal. No respiratory distress.  Abdominal: Soft. There is no tenderness.  +R CVA tenderness  Musculoskeletal: He exhibits no edema.  Neurological: He is alert and oriented to person, place, and time.  Skin: Skin is warm and dry. He is not diaphoretic.  Psychiatric: His behavior is normal.    ED Course  Procedures (including critical care time) Labs Review Labs Reviewed  COMPREHENSIVE METABOLIC PANEL - Abnormal; Notable for the following:    Glucose, Bld 101 (*)    Total Bilirubin 1.7 (*)    All other components within normal limits  CBC WITH DIFFERENTIAL  LIPASE, BLOOD  URINALYSIS, ROUTINE W REFLEX MICROSCOPIC    Imaging Review No results found.   EKG Interpretation None      MDM   Final diagnoses:  Ureteral stone with hydronephrosis    7:21 AM BP 131/80  Pulse 72  Temp(Src) 98.3 F (36.8 C) (Oral)  Resp 18  Ht 6' (1.829 m)  Wt 150 lb (68.04 kg)  BMI 20.34 kg/m2  SpO2 100% Patient with R flank pain. Hx of complicated Left renal stone requiring lithotropsy. Non-toxic and afebrile. Pain control initiated.  No abdominal tenderness. No leukocytosis. Awaiting imaging and urine. Patient / Family / Caregiver understand and agree with initial ED impression and plan with expectations set for ED visit.   8:06 AM BP 131/80  Pulse 72  Temp(Src) 98.3 F (36.8 C) (Oral)  Resp 18  Ht 6' (1.829 m)  Wt 150 lb (68.04 kg)  BMI 20.34 kg/m2  SpO2 100% Patient with 10 mm obstructing sone at the R UPJ wth hydronephrosis. Renal fx appears normal. Awaiting UA. I have placed a call to urology. Patient updated.   8:50 AM BP  131/80  Pulse 72  Temp(Src) 98.3 F (36.8 C) (Oral)  Resp 18  Ht 6' (1.829 m)  Wt 150 lb (68.04 kg)  BMI 20.34 kg/m2  SpO2 100% I spoke with Dr. Brunilda Payor who will come to see the patient here in the ED.    Dr. Brunilda Payor has consulted with the patient. His pain is well controlled at this time. We will discharge the patient home to follow up wit the clinic in 2 days. Return for pain control or intractable nausea and vomiting. I personally reviewed the imaging tests through PACS system. I have reviewed and interpreted Lab values. I reviewed available ER/hospitalization records through the EMR      Arthor Captain, [place] 05/06/14 1607

## 2014-05-05 NOTE — Progress Notes (Signed)
[facility] provided pt with a [employer] application and a list of primary care resources to help patient establish primary care.

## 2014-05-05 NOTE — ED Notes (Signed)
Patient transported to CT 

## 2014-05-05 NOTE — ED Notes (Signed)
Pt complains of right flank pain since 4am, has vomited twice, hx of kidney stones and he states the pain is the same

## 2014-05-05 NOTE — Discharge Instructions (Signed)
Return to the ED immediately if you develop fever, uncontrolled pain or vomiting, or other concerns. DO NOT TAKE ANY NSAIDS (MOTRIN/ALEVE/ ADVIL/NAPROXEN/ASPIRIN) WITHIN 24 HOURS OF YOUR PROCEDURE.  Ureteral Colic (Kidney Stones) Ureteral colic is the result of a condition when kidney stones form inside the kidney. Once kidney stones are formed they may move into the tube that connects the kidney with the bladder (ureter). If this occurs, this condition may cause pain (colic) in the ureter.  CAUSES  Pain is caused by stone movement in the ureter and the obstruction caused by the stone. SYMPTOMS  The pain comes and goes as the ureter contracts around the stone. The pain is usually intense, sharp, and stabbing in character. The location of the pain may move as the stone moves through the ureter. When the stone is near the kidney the pain is usually located in the back and radiates to the belly (abdomen). When the stone is ready to pass into the bladder the pain is often located in the lower abdomen on the side the stone is located. At this location, the symptoms may mimic those of a urinary tract infection with urinary frequency. Once the stone is located here it often passes into the bladder and the pain disappears completely. TREATMENT   Your caregiver will provide you with medicine for pain relief.  You may require specialized follow-up X-rays.  The absence of pain does not always mean that the stone has passed. It may have just stopped moving. If the urine remains completely obstructed, it can cause loss of kidney function or even complete destruction of the involved kidney. It is your responsibility and in your interest that X-rays and follow-ups as suggested by your caregiver are completed. Relief of pain without passage of the stone can be associated with severe damage to the kidney, including loss of kidney function on that side.  If your stone does not pass on its own, additional measures  may be taken by your caregiver to ensure its removal. HOME CARE INSTRUCTIONS   Increase your fluid intake. Water is the preferred fluid since juices containing vitamin C may acidify the urine making it less likely for certain stones (uric acid stones) to pass.  Strain all urine. A strainer will be provided. Keep all particulate matter or stones for your caregiver to inspect.  Take your pain medicine as directed.  Make a follow-up appointment with your caregiver as directed.  Remember that the goal is passage of your stone. The absence of pain does not mean the stone is gone. Follow your caregiver's instructions.  Only take over-the-counter or prescription medicines for pain, discomfort, or fever as directed by your caregiver. SEEK MEDICAL CARE IF:   Pain cannot be controlled with the prescribed medicine.  You have a fever.  Pain continues for longer than your caregiver advises it should.  There is a change in the pain, and you develop chest discomfort or constant abdominal pain.  You feel faint or pass out. MAKE SURE YOU:   Understand these instructions.  Will watch your condition.  Will get help right away if you are not doing well or get worse. Document Released: 09/06/2005 Document Revised: 03/24/2013 Document Reviewed: 05/24/2011 [facility] Patient Information 2014 [place], [place].  Kidney Stones Kidney stones (urolithiasis) are deposits that form inside your kidneys. The intense pain is caused by the stone moving through the urinary tract. When the stone moves, the ureter goes into spasm around the stone. The stone is usually passed  in the urine.  CAUSES   A disorder that makes certain neck glands produce too much parathyroid hormone (primary hyperparathyroidism).  A buildup of uric acid crystals, similar to gout in your joints.  Narrowing (stricture) of the ureter.  A kidney obstruction present at birth (congenital obstruction).  Previous surgery on the kidney or  ureters.  Numerous kidney infections. SYMPTOMS   Feeling sick to your stomach (nauseous).  Throwing up (vomiting).  Blood in the urine (hematuria).  Pain that usually spreads (radiates) to the groin.  Frequency or urgency of urination. DIAGNOSIS   Taking a history and physical exam.  Blood or urine tests.  CT scan.  Occasionally, an examination of the inside of the urinary bladder (cystoscopy) is performed. TREATMENT   Observation.  Increasing your fluid intake.  Extracorporeal shock wave lithotripsy This is a noninvasive procedure that uses shock waves to break up kidney stones.  Surgery may be needed if you have severe pain or persistent obstruction. There are various surgical procedures. Most of the procedures are performed with the use of small instruments. Only small incisions are needed to accommodate these instruments, so recovery time is minimized. The size, location, and chemical composition are all important variables that will determine the proper choice of action for you. Talk to your health care provider to better understand your situation so that you will minimize the risk of injury to yourself and your kidney.  HOME CARE INSTRUCTIONS   Drink enough water and fluids to keep your urine clear or pale yellow. This will help you to pass the stone or stone fragments.  Strain all urine through the provided strainer. Keep all particulate matter and stones for your health care provider to see. The stone causing the pain may be as small as a grain of salt. It is very important to use the strainer each and every time you pass your urine. The collection of your stone will allow your health care provider to analyze it and verify that a stone has actually passed. The stone analysis will often identify what you can do to reduce the incidence of recurrences.  Only take over-the-counter or prescription medicines for pain, discomfort, or fever as directed by your health care  provider.  Make a follow-up appointment with your health care provider as directed.  Get follow-up X-rays if required. The absence of pain does not always mean that the stone has passed. It may have only stopped moving. If the urine remains completely obstructed, it can cause loss of kidney function or even complete destruction of the kidney. It is your responsibility to make sure X-rays and follow-ups are completed. Ultrasounds of the kidney can show blockages and the status of the kidney. Ultrasounds are not associated with any radiation and can be performed easily in a matter of minutes. SEEK MEDICAL CARE IF:  You experience pain that is progressive and unresponsive to any pain medicine you have been prescribed. SEEK IMMEDIATE MEDICAL CARE IF:   Pain cannot be controlled with the prescribed medicine.  You have a fever or shaking chills.  The severity or intensity of pain increases over 18 hours and is not relieved by pain medicine.  You develop a new onset of abdominal pain.  You feel faint or pass out.  You are unable to urinate. MAKE SURE YOU:   Understand these instructions.  Will watch your condition.  Will get help right away if you are not doing well or get worse. Document Released: 11/27/2005 Document Revised: 07/30/2013  Document Reviewed: 04/30/2013 [facility] Patient Information 2014 [place], [place].  Diet for Kidney Stones Kidney stones are small, hard masses that form inside your kidneys. They are made up of salts and minerals and often form when high levels build up in the urine. The minerals can then start to build up, crystalize, and stick together to form stones. There are several different types of kidney stones. The following types of stones may be influenced by dietary factors:   Calcium Oxalate Stones. An oxalate is a salt found in certain foods. Within the body, calcium can combine with oxalates to form calcium oxalate stones, which can be excreted in the urine  in high amounts. This is the most common type of kidney stone.  Calcium Phosphate Stones. These stones may occur when the pH of the urine becomes too high, or less acidic, from too much calcium being excreted in the urine. The pH is a measure of how acidic or basic a substance is.  Uric Acid Stones. This type of stone occurs when the pH of the urine becomes too low, or very acidic, because substances called purines build up in the urine. Purines are found in animal proteins. When the urine is highly concentrated with acid, uric acid kidney stones can form.  Other risk factors for kidney stones include genetics, environment, and being overweight. Your caregiver may ask you to follow specific diet guidelines based on the type of stone you have to lessen the chances of your body making more kidney stones.  GENERAL GUIDELINES FOR ALL TYPES OF STONES  Drink plenty of fluid. Drink 12 16 cups of fluid a day, drinking mainly water.This is the most important thing you can do to prevent the formation of future kidney stones.  Maintain a healthy weight. Your caregiver or dietitian can help you determine what a healthy weight is for you. If you are overweight, weight loss may help prevent the formation of future kidney stones.  Eat a diet adequate in animal protein. Too much animal protein can contribute to the formation of stones. Your dietitian can help you determine how much protein you should be eating. Avoid low carbohydrate, high protein diets.  Follow a balanced eating approach. The DASH diet, which stands for "Dietary Approaches to Stop Hypertension," is an effective meal plan for reducing stone formation. This diet is high in fruits, vegetables, dairy, and whole grains and low in animal protein. Ask your caregiver or dietitian for information about the DASH diet. ADDITIONAL DIET GUIDELINES FOR CALCIUM STONES Avoid foods high in salt. This includes table salt, salt seasonings, MSG, soy sauce, cured and  processed meats, salted crackers and snack foods, fast food, and canned soups and foods. Ask your caregiver or dietitian for information about reducing sodium in your diet or following the low sodium diet.  Ensure adequate calcium intake. Use the following table for calcium guidelines:  Men 38 years old and younger  1000 mg/day.  Men 42 years old and older  1500 mg/day.  Women 34 27 years old  1000 mg/day.  Women 50 years and older  1500 mg/day. Your dietitian can help you determine if you are getting enough calcium in your diet. Foods that are high in calcium include dairy products, broccoli, cheese, yogurt, and pudding. If you need to take a calcium supplement, take it only in the form of calcium citrate.  Avoid foods high in oxalate. Be sure that any supplements you take do not contain more than 500 mg of vitamin C.  Vitamin C is converted into oxalate in the body. You do not need to avoid fruits and vegetables high in vitamin C.   Grains: High-fiber or bran cereal, whole-wheat bread, grits, barley, buckwheat, amaranth, pretzels, and fruitcake.  Vegetables: Dried beans, wax beans, dark leafy greens, eggplant, leeks, okra, parsley, rutabaga, tomato paste, watercress, zucchini, and escarole.  Fruit: Dried apricots, red currants, figs, kiwi, and rhubarb.  Meat and Meat Substitutes: Soybeans and foods made from soy (soyburger, miso), dried beans, peanut butter.  Milk: Chocolate milk mixes and soymilk.  Fats and Oils: Nuts (peanuts, almonds, pecans, cashews, hazelnuts) and nut butters, sesame seeds, and tDahini paste.  Condiments/Miscellaneous: Chocolate, carob, marmalade, poppy seeds, instant iced tea, and juice from high-oxalate fruits.  Document Released: 03/24/2011 Document Revised: 05/28/2012 Document Reviewed: 05/13/2012 [facility] Patient Information 2014 [place], [place].

## 2014-05-05 NOTE — ED Notes (Signed)
Urinal at bedside, patient aware of need for urine specimen.

## 2014-05-06 ENCOUNTER — Encounter (HOSPITAL_COMMUNITY): Payer: Self-pay | Admitting: Pharmacy Technician

## 2014-05-07 ENCOUNTER — Encounter (HOSPITAL_COMMUNITY): Payer: Self-pay | Admitting: General Practice

## 2014-05-07 NOTE — ED Provider Notes (Signed)
Medical screening examination/treatment/procedure(s) were performed by non-physician practitioner and as supervising physician I was immediately available for consultation/collaboration.   EKG Interpretation None       Derwood Kaplan, MD 05/07/14 [phone]

## 2014-05-11 ENCOUNTER — Ambulatory Visit (HOSPITAL_COMMUNITY)
Admission: RE | Admit: 2014-05-11 | Discharge: 2014-05-11 | Disposition: A | Discharge status: Home / Self Care | Payer: Self-pay | Source: Ambulatory Visit | Attending: Urology | Admitting: Urology

## 2014-05-11 ENCOUNTER — Encounter (HOSPITAL_COMMUNITY): Payer: Self-pay | Admitting: General Practice

## 2014-05-11 ENCOUNTER — Encounter (HOSPITAL_COMMUNITY)
Admission: RE | Disposition: A | Discharge status: Home / Self Care | Payer: Self-pay | Source: Ambulatory Visit | Attending: Urology

## 2014-05-11 ENCOUNTER — Ambulatory Visit (HOSPITAL_COMMUNITY): Payer: Self-pay

## 2014-05-11 DIAGNOSIS — N200 Calculus of kidney: Secondary | ICD-10-CM | POA: Insufficient documentation

## 2014-05-11 SURGERY — LITHOTRIPSY, ESWL
Anesthesia: LOCAL | Laterality: Right

## 2014-05-11 MED ORDER — DIPHENHYDRAMINE HCL 25 MG PO CAPS
25.0000 mg | ORAL_CAPSULE | ORAL | Status: AC
  Administered 2014-05-11: 25 mg via ORAL
  Filled 2014-05-11: qty 1

## 2014-05-11 MED ORDER — CIPROFLOXACIN HCL 500 MG PO TABS
500.0000 mg | ORAL_TABLET | ORAL | Status: AC
  Administered 2014-05-11: 500 mg via ORAL
  Filled 2014-05-11: qty 1

## 2014-05-11 MED ORDER — DIAZEPAM 5 MG PO TABS
10.0000 mg | ORAL_TABLET | ORAL | Status: AC
  Administered 2014-05-11: 10 mg via ORAL
  Filled 2014-05-11: qty 2

## 2014-05-11 MED ORDER — DEXTROSE-NACL 5-0.45 % IV SOLN
INTRAVENOUS | Status: DC
  Administered 2014-05-11: 11:00:00 via INTRAVENOUS

## 2014-05-11 NOTE — H&P (Signed)
History of Present Illness Darryl Chavez was seen in the ER 3 days ago for sudden onset of severe right flank pain associated with nausea and vomiting. CT showed a 10 mm right renal pelvis calculus with mild hydronephrosis. He also has bilateral non obstructing renal calculi. he had left PCNL in 2013. He was discharged home on oral analgesics. He has mild flank pain on and off. He voids well. He is here to discuss definitive management of the right renal pelvis calculus. I went over the treatment options with him: ESL, PCNL, ureteroscopy. The risks, benefits of each option were reviewed. I told him that ESL is the least invasive of those procedures.   Past Medical History Problems  1. History of kidney stones (V13.01)  Surgical History Problems  1. History of Lithotripsy 2. History of Lithotripsy  Current Meds 1. Percocet TABS (Oxycodone-Acetaminophen);  Therapy: (Recorded:29May2015) to Recorded 2. Phenergan TABS (Promethazine HCl);  Therapy: (Recorded:29May2015) to Recorded  Allergies Medication  1. No Known Drug Allergies  Family History Problems  1. Family history of Family Health Status - Father's Age   64 2. Family history of Family Health Status - Mother's Age   31 3. Family history of Nephrolithiasis  Social History Problems  1. Denied: History of Alcohol Use 2. Denied: History of Caffeine Use 3. Marital History - Single 4. Never A Smoker 5. Occupation:   Marketing executive  Review of Systems Genitourinary, constitutional, skin, eye, otolaryngeal, hematologic/lymphatic, cardiovascular, pulmonary, endocrine, musculoskeletal, gastrointestinal, neurological and psychiatric system(s) were reviewed and pertinent findings if present are noted.  Gastrointestinal: flank pain.    Vitals Vital Signs [Data Includes: Last 1 Day]  Recorded: 29May2015 09:12AM  Height: 6 ft  Weight: 151 lb  BMI Calculated: 20.48 BSA Calculated: 1.89 Blood Pressure: 122 / 78 Temperature: 98.4  F Heart Rate: 71  Physical Exam Constitutional: Well nourished and well developed . No acute distress.  ENT:. The ears and nose are normal in appearance.  Neck: The appearance of the neck is normal and no neck mass is present.  Pulmonary: No respiratory distress and normal respiratory rhythm and effort.  Cardiovascular: Heart rate and rhythm are normal . No peripheral edema.  Abdomen: The abdomen is soft and nontender. No masses are palpated. No CVA tenderness. No hernias are palpable. No hepatosplenomegaly noted.  Genitourinary: Examination of the penis demonstrates no discharge, no masses, no lesions and a normal meatus. The scrotum is without lesions. The right epididymis is palpably normal and non-tender. The left epididymis is palpably normal and non-tender. The right testis is non-tender and without masses. The left testis is non-tender and without masses.  Lymphatics: The femoral and inguinal nodes are not enlarged or tender.  Skin: Normal skin turgor, no visible rash and no visible skin lesions.  Neuro/Psych:. Mood and affect are appropriate.    Results/Data Urine [Data Includes: Last 1 Day]   29May2015 COLOR AMBER  APPEARANCE CLOUDY  SPECIFIC GRAVITY >1.030  pH 6.0  GLUCOSE NEG mg/dL BILIRUBIN SMALL  KETONE TRACE mg/dL BLOOD LARGE  PROTEIN 188 mg/dL UROBILINOGEN 1 mg/dL NITRITE NEG  LEUKOCYTE ESTERASE NEG  SQUAMOUS EPITHELIAL/HPF RARE  WBC 0-2 WBC/hpf RBC TNTC RBC/hpf BACTERIA MODERATE  CRYSTALS NONE SEEN  CASTS NONE SEEN  Other MUCUS   I reviewed the CT scan and the findings are as noted above.   Assessment Assessed  1. Kidney stone on right side (592.0) 2. Kidney stone on left side (592.0)  Plan Health Maintenance  1. UA With REFLEX; [Do  Not Release]; Status:Resulted - Requires Verification;   Done:  29May2015 09:00AM Kidney stone on right side  2. Start: Hydrocodone-Acetaminophen 5-325 MG Oral Tablet; TAKE 1 TABLET Every  4 hours 3. KUB; Status:Hold For  - Date of Service; Requested for:25Jun2015;  4. Follow-up Office  Follow-up - patient to call back to schedule 3 week post ESWL KUB  and OV w/ Ryden Wainer once he has work schedule.  Status: Hold For - Date of Service   Requested for: 25Jun2015  He agrees to proceed with ESL. The risks include but are not limited to hemorrhage, renal or perirenal hematoma, inability to fragment the stone, steinstrasse. He understands and wishes to proceed.

## 2014-05-11 NOTE — Op Note (Signed)
Refer to [facility] Op Note scanned in the chart 

## 2014-10-10 ENCOUNTER — Emergency Department (HOSPITAL_COMMUNITY): Payer: Self-pay

## 2014-10-10 ENCOUNTER — Encounter (HOSPITAL_COMMUNITY): Payer: Self-pay | Admitting: Emergency Medicine

## 2014-10-10 ENCOUNTER — Emergency Department (HOSPITAL_COMMUNITY)
Admission: EM | Admit: 2014-10-10 | Discharge: 2014-10-10 | Disposition: A | Discharge status: Home / Self Care | Payer: Self-pay | Attending: Emergency Medicine | Admitting: Emergency Medicine

## 2014-10-10 DIAGNOSIS — N23 Unspecified renal colic: Secondary | ICD-10-CM | POA: Insufficient documentation

## 2014-10-10 DIAGNOSIS — R109 Unspecified abdominal pain: Secondary | ICD-10-CM

## 2014-10-10 DIAGNOSIS — Z87442 Personal history of urinary calculi: Secondary | ICD-10-CM | POA: Insufficient documentation

## 2014-10-10 LAB — CBC WITH DIFFERENTIAL/PLATELET
Basophils Absolute: 0 10*3/uL (ref 0.0–0.1)
Basophils Relative: 0 % (ref 0–1)
EOS ABS: 0 10*3/uL (ref 0.0–0.7)
Eosinophils Relative: 0 % (ref 0–5)
HEMATOCRIT: 42 % (ref 39.0–52.0)
HEMOGLOBIN: 14.2 g/dL (ref 13.0–17.0)
LYMPHS ABS: 1.2 10*3/uL (ref 0.7–4.0)
Lymphocytes Relative: 9 % — ABNORMAL LOW (ref 12–46)
MCH: 31.2 pg (ref 26.0–34.0)
MCHC: 33.8 g/dL (ref 30.0–36.0)
MCV: 92.3 fL (ref 78.0–100.0)
MONO ABS: 0.4 10*3/uL (ref 0.1–1.0)
MONOS PCT: 3 % (ref 3–12)
NEUTROS PCT: 88 % — AB (ref 43–77)
Neutro Abs: 11.6 10*3/uL — ABNORMAL HIGH (ref 1.7–7.7)
Platelets: 204 10*3/uL (ref 150–400)
RBC: 4.55 MIL/uL (ref 4.22–5.81)
RDW: 12.8 % (ref 11.5–15.5)
WBC: 13.2 10*3/uL — ABNORMAL HIGH (ref 4.0–10.5)

## 2014-10-10 LAB — BASIC METABOLIC PANEL
Anion gap: 12 (ref 5–15)
BUN: 16 mg/dL (ref 6–23)
CO2: 28 mEq/L (ref 19–32)
CREATININE: 0.91 mg/dL (ref 0.50–1.35)
Calcium: 9.5 mg/dL (ref 8.4–10.5)
Chloride: 103 mEq/L (ref 96–112)
GFR calc Af Amer: >90 mL/min (ref >90)
Glucose, Bld: 109 mg/dL — ABNORMAL HIGH (ref 70–99)
POTASSIUM: 4.2 meq/L (ref 3.7–5.3)
Sodium: 143 mEq/L (ref 137–147)

## 2014-10-10 LAB — URINALYSIS, ROUTINE W REFLEX MICROSCOPIC
Bilirubin Urine: NEGATIVE
Glucose, UA: NEGATIVE mg/dL
HGB URINE DIPSTICK: NEGATIVE
Ketones, ur: NEGATIVE mg/dL
Leukocytes, UA: NEGATIVE
Nitrite: NEGATIVE
PH: 6.5 (ref 5.0–8.0)
Protein, ur: NEGATIVE mg/dL
SPECIFIC GRAVITY, URINE: 1.027 (ref 1.005–1.030)
Urobilinogen, UA: 0.2 mg/dL (ref 0.0–1.0)

## 2014-10-10 MED ORDER — OXYCODONE-ACETAMINOPHEN 5-325 MG PO TABS: ORAL_TABLET | ORAL | Status: DC

## 2014-10-10 MED ORDER — KETOROLAC TROMETHAMINE 30 MG/ML IJ SOLN
15.0000 mg | Freq: Once | INTRAMUSCULAR | Status: AC
  Administered 2014-10-10: 15 mg via INTRAVENOUS
  Filled 2014-10-10: qty 1

## 2014-10-10 MED ORDER — MORPHINE SULFATE 4 MG/ML IJ SOLN
4.0000 mg | Freq: Once | INTRAMUSCULAR | Status: AC
  Administered 2014-10-10: 4 mg via INTRAVENOUS
  Filled 2014-10-10: qty 1

## 2014-10-10 MED ORDER — ONDANSETRON HCL 4 MG/2ML IJ SOLN
4.0000 mg | Freq: Once | INTRAMUSCULAR | Status: AC
  Administered 2014-10-10: 4 mg via INTRAVENOUS
  Filled 2014-10-10: qty 2

## 2014-10-10 NOTE — ED Notes (Signed)
PA @ bedside.

## 2014-10-10 NOTE — ED Notes (Signed)
Pt c/o side flank pain, 6/10 discomfort x1 day. Reports hx of kidney stones. Was instructed by urologist Dr . Julien GirtNessi to come to ED if pain unrelieved with home pain medication. Lithotripsy x1 year.

## 2014-10-10 NOTE — Discharge Instructions (Signed)
Push fluids: take small frequent sips of water or Gatorade, do not drink any soda, juice or caffeinated beverages. ° °Take percocet for breakthrough pain, do not drink alcohol, drive, care for children or do other critical tasks while taking percocet. ° °Strain all urine and retain any stone for analysis at your urologist.  ° °Return to the ED if you develop fever, have vomiting that is not controlled with the medication or if pain becomes too severe. ° °

## 2014-10-10 NOTE — ED Provider Notes (Signed)
Medical screening examination/treatment/procedure(s) were performed by non-physician practitioner and as supervising physician I was immediately available for consultation/collaboration.   EKG Interpretation None        Hoke Baer M Magon Croson, MD 10/10/14 1519 

## 2014-10-10 NOTE — ED Provider Notes (Signed)
CSN: [id number]     Arrival date & time 10/10/14  1105 History   First MD Initiated Contact with Patient 10/10/14 1112     Chief Complaint  Patient presents with  . Flank Pain     (Consider location/radiation/quality/duration/timing/severity/associated sxs/prior Treatment) HPI  Darryl Chavez is a 28 y.o. male complaining of acute onset of severe left flank pain and this morning. Patient took Vicodin, without relief. Has history of kidney stones that have required lithotripsy and stents for passage. Patient follows with Dr. Brunilda PayorNesi, called their service and was advised to come to the ED. Patient denies hematuria, dysuria, fever, chills, nausea, vomiting.   Past Medical History  Diagnosis Date  . Renal calculus     left pelvic   Past Surgical History  Procedure Laterality Date  . Lithotrispy  2007  . Nephrolithotomy  05/07/2012    Procedure: NEPHROLITHOTOMY PERCUTANEOUS;  Surgeon: Lindaann SloughMarc-Henry Nesi, MD;  Location: [facility];  Service: Urology;  Laterality: Left;   History reviewed. No pertinent family history. History  Substance Use Topics  . Smoking status: Never Smoker   . Smokeless tobacco: Never Used  . Alcohol Use: No    Review of Systems  10 systems reviewed and found to be negative, except as noted in the HPI.   Allergies  Review of patient's allergies indicates no known allergies.  Home Medications   Prior to Admission medications   Medication Sig Start Date End Date Taking? Authorizing Provider  Hydrocodone-Acetaminophen (VICODIN PO) Take 1 tablet by mouth once.   Yes Historical Provider, MD  oxyCODONE-acetaminophen (PERCOCET/ROXICET) 5-325 MG per tablet 1 to 2 tabs PO q6hrs  PRN for pain 10/10/14   Joni ReiningNicole Paulo Keimig, PA-C  promethazine (PHENERGAN) 25 MG tablet Take 1 tablet (25 mg total) by mouth every 6 (six) hours as needed for nausea or vomiting. 05/05/14   Arthor CaptainAbigail Harris, PA-C   BP 124/72  Pulse 62  Temp(Src) 98.6 F (37 C) (Oral)  Resp 18  SpO2  100% Physical Exam  Nursing note and vitals reviewed. Constitutional: He is oriented to person, place, and time. He appears well-developed and well-nourished. No distress.  HENT:  Head: Normocephalic and atraumatic.  Mouth/Throat: Oropharynx is clear and moist.  Eyes: Conjunctivae and EOM are normal. Pupils are equal, round, and reactive to light.  Neck: Normal range of motion.  Cardiovascular: Normal rate and regular rhythm.   Pulmonary/Chest: Effort normal and breath sounds normal. No stridor. No respiratory distress. He has no wheezes. He has no rales. He exhibits no tenderness.  Abdominal: Soft. Bowel sounds are normal. He exhibits no distension and no mass. There is no tenderness. There is no rebound and no guarding.  Genitourinary:  No CVA TTP bilaterally  Musculoskeletal: Normal range of motion.  Neurological: He is alert and oriented to person, place, and time.  Psychiatric: He has a normal mood and affect.    ED Course  Procedures (including critical care time) Labs Review Labs Reviewed  CBC WITH DIFFERENTIAL - Abnormal; Notable for the following:    WBC 13.2 (*)    Neutrophils Relative % 88 (*)    Neutro Abs 11.6 (*)    Lymphocytes Relative 9 (*)    All other components within normal limits  BASIC METABOLIC PANEL - Abnormal; Notable for the following:    Glucose, Bld 109 (*)    All other components within normal limits  URINALYSIS, ROUTINE W REFLEX MICROSCOPIC    Imaging Review Ct Renal Stone Study  10/10/2014   CLINICAL  DATA:  Left flank pain  EXAM: CT ABDOMEN AND PELVIS WITHOUT CONTRAST  TECHNIQUE: Multidetector CT imaging of the abdomen and pelvis was performed following the standard protocol without IV contrast.  COMPARISON:  05/05/2014  FINDINGS: 4 mm calculus in the upper pole of the left kidney. 7 mm calculus laying in the dependent portion of 8 mildly dilated left renal pelvis. There is some inflammatory change about the left renal pelvis. No other renal  calculi are present. The remainder of the ureter is normal in caliber. These findings may represent intermittent obstruction at the ureteropelvic junction secondary to this layering pelvic calculus. Tiny calculi are present in the upper pole of the right kidney.  Liver, gallbladder, spleen, pancreas, adrenal glands are within normal limits  Normal appendix.  Bladder and prostate are within normal limits  No vertebral compression deformity. Central disc protrusion at L5-S1. No destructive bone lesion.  IMPRESSION: Left nephrolithiasis.  The left renal pelvis is dilated and there is a calculus layering in the dependent portion. It is possible that this represents intermittent obstruction of the renal pelvis secondary to this calculus. There are mild inflammatory changes about the renal pelvis. Correlate with urinalysis.   Electronically Signed   By: Maryclare BeanArt  Hoss M.D.   On: 10/10/2014 13:23     EKG Interpretation None      MDM   Final diagnoses:  Left flank pain  Renal colic on left side    Filed Vitals:   10/10/14 1114 10/10/14 1345  BP: 132/63 124/72  Pulse: 65 62  Temp: 98 F (36.7 C) 98.6 F (37 C)  TempSrc: Oral Oral  Resp: 17 18  SpO2: 100% 100%    Medications  morphine 4 MG/ML injection 4 mg (4 mg Intravenous Given 10/10/14 1135)  ondansetron (ZOFRAN) injection 4 mg (4 mg Intravenous Given 10/10/14 1135)  ketorolac (TORADOL) 30 MG/ML injection 15 mg (15 mg Intravenous Given 10/10/14 1337)    Darryl Chavez is a 28 y.o. male presenting with severe left flank pain onset this morning, no systemic signs of infection. Mild leukocytosis of 13.2. Urinalysis with no signs of infection. CT shows left-sided hydronephrosis with intrarenal stones ranging from 4-7 mm. Pain improved in the ED, no issues with tolerating by mouth. Patient will follow with Dr. Brunilda PayorNesi see this week.  Evaluation does not show pathology that would require ongoing emergent intervention or inpatient treatment. Pt is  hemodynamically stable and mentating appropriately. Discussed findings and plan with patient/guardian, who agrees with care plan. All questions answered. Return precautions discussed and outpatient follow up given.   Discharge Medication List as of 10/10/2014  1:42 PM    START taking these medications   Details  oxyCODONE-acetaminophen (PERCOCET/ROXICET) 5-325 MG per tablet 1 to 2 tabs PO q6hrs  PRN for pain, Print             Wynetta Emeryicole Cornelius Marullo, PA-C 10/10/14 1442

## 2015-04-10 ENCOUNTER — Encounter (HOSPITAL_COMMUNITY): Payer: Self-pay | Admitting: Emergency Medicine

## 2015-04-10 ENCOUNTER — Emergency Department (HOSPITAL_COMMUNITY)
Admission: EM | Admit: 2015-04-10 | Discharge: 2015-04-10 | Disposition: A | Discharge status: Home / Self Care | Payer: BLUE CROSS/BLUE SHIELD | Attending: Emergency Medicine | Admitting: Emergency Medicine

## 2015-04-10 ENCOUNTER — Emergency Department (HOSPITAL_COMMUNITY): Payer: BLUE CROSS/BLUE SHIELD

## 2015-04-10 DIAGNOSIS — R109 Unspecified abdominal pain: Secondary | ICD-10-CM

## 2015-04-10 DIAGNOSIS — N200 Calculus of kidney: Secondary | ICD-10-CM | POA: Diagnosis not present

## 2015-04-10 DIAGNOSIS — R1030 Lower abdominal pain, unspecified: Secondary | ICD-10-CM | POA: Diagnosis present

## 2015-04-10 LAB — URINALYSIS, ROUTINE W REFLEX MICROSCOPIC
BILIRUBIN URINE: NEGATIVE
Glucose, UA: NEGATIVE mg/dL
KETONES UR: 15 mg/dL — AB
Leukocytes, UA: NEGATIVE
Nitrite: NEGATIVE
PROTEIN: 30 mg/dL — AB
Specific Gravity, Urine: 1.027 (ref 1.005–1.030)
Urobilinogen, UA: 0.2 mg/dL (ref 0.0–1.0)
pH: 5.5 (ref 5.0–8.0)

## 2015-04-10 LAB — COMPREHENSIVE METABOLIC PANEL
ALT: 13 U/L (ref 0–53)
AST: 18 U/L (ref 0–37)
Albumin: 4.7 g/dL (ref 3.5–5.2)
Alkaline Phosphatase: 44 U/L (ref 39–117)
Anion gap: 7 (ref 5–15)
BUN: 20 mg/dL (ref 6–23)
CALCIUM: 9.1 mg/dL (ref 8.4–10.5)
CO2: 25 mmol/L (ref 19–32)
Chloride: 107 mmol/L (ref 96–112)
Creatinine, Ser: 0.91 mg/dL (ref 0.50–1.35)
GFR calc Af Amer: >90 mL/min (ref >90)
GFR calc non Af Amer: >90 mL/min (ref >90)
Glucose, Bld: 105 mg/dL — ABNORMAL HIGH (ref 70–99)
POTASSIUM: 3.6 mmol/L (ref 3.5–5.1)
Sodium: 139 mmol/L (ref 135–145)
Total Bilirubin: 3.1 mg/dL — ABNORMAL HIGH (ref 0.3–1.2)
Total Protein: 7.2 g/dL (ref 6.0–8.3)

## 2015-04-10 LAB — I-STAT CHEM 8, ED
BUN: 21 mg/dL (ref 6–23)
CALCIUM ION: 1.17 mmol/L (ref 1.12–1.23)
CHLORIDE: 105 mmol/L (ref 96–112)
Creatinine, Ser: 0.9 mg/dL (ref 0.50–1.35)
GLUCOSE: 105 mg/dL — AB (ref 70–99)
HCT: 42 % (ref 39.0–52.0)
Hemoglobin: 14.3 g/dL (ref 13.0–17.0)
Potassium: 3.7 mmol/L (ref 3.5–5.1)
Sodium: 142 mmol/L (ref 135–145)
TCO2: 21 mmol/L (ref 0–100)

## 2015-04-10 LAB — CBC WITH DIFFERENTIAL/PLATELET
BASOS ABS: 0 10*3/uL (ref 0.0–0.1)
BASOS PCT: 0 % (ref 0–1)
Eosinophils Absolute: 0.1 10*3/uL (ref 0.0–0.7)
Eosinophils Relative: 2 % (ref 0–5)
HCT: 39.5 % (ref 39.0–52.0)
HEMOGLOBIN: 13.1 g/dL (ref 13.0–17.0)
Lymphocytes Relative: 29 % (ref 12–46)
Lymphs Abs: 1.5 10*3/uL (ref 0.7–4.0)
MCH: 30.8 pg (ref 26.0–34.0)
MCHC: 33.2 g/dL (ref 30.0–36.0)
MCV: 92.9 fL (ref 78.0–100.0)
Monocytes Absolute: 0.5 10*3/uL (ref 0.1–1.0)
Monocytes Relative: 9 % (ref 3–12)
NEUTROS ABS: 3.1 10*3/uL (ref 1.7–7.7)
Neutrophils Relative %: 60 % (ref 43–77)
Platelets: 152 10*3/uL (ref 150–400)
RBC: 4.25 MIL/uL (ref 4.22–5.81)
RDW: 12.6 % (ref 11.5–15.5)
WBC: 5.3 10*3/uL (ref 4.0–10.5)

## 2015-04-10 LAB — URINE MICROSCOPIC-ADD ON

## 2015-04-10 LAB — LIPASE, BLOOD: Lipase: 28 U/L (ref 11–59)

## 2015-04-10 MED ORDER — SODIUM CHLORIDE 0.9 % IV BOLUS (SEPSIS)
1000.0000 mL | Freq: Once | INTRAVENOUS | Status: AC
  Administered 2015-04-10: 1000 mL via INTRAVENOUS

## 2015-04-10 MED ORDER — TAMSULOSIN HCL 0.4 MG PO CAPS: 0.4000 mg | ORAL_CAPSULE | Freq: Two times a day (BID) | ORAL | Status: DC

## 2015-04-10 MED ORDER — ONDANSETRON HCL 4 MG/2ML IJ SOLN
4.0000 mg | Freq: Once | INTRAMUSCULAR | Status: AC
  Administered 2015-04-10: 4 mg via INTRAVENOUS
  Filled 2015-04-10: qty 2

## 2015-04-10 MED ORDER — KETOROLAC TROMETHAMINE 30 MG/ML IJ SOLN
30.0000 mg | Freq: Once | INTRAMUSCULAR | Status: AC
  Administered 2015-04-10: 30 mg via INTRAVENOUS
  Filled 2015-04-10: qty 1

## 2015-04-10 MED ORDER — MORPHINE SULFATE 4 MG/ML IJ SOLN
4.0000 mg | Freq: Once | INTRAMUSCULAR | Status: AC
  Administered 2015-04-10: 4 mg via INTRAVENOUS
  Filled 2015-04-10: qty 1

## 2015-04-10 MED ORDER — PROMETHAZINE HCL 25 MG PO TABS: 25.0000 mg | ORAL_TABLET | Freq: Four times a day (QID) | ORAL | Status: DC | PRN

## 2015-04-10 MED ORDER — HYDROMORPHONE HCL 1 MG/ML IJ SOLN
1.0000 mg | Freq: Once | INTRAMUSCULAR | Status: AC
  Administered 2015-04-10: 1 mg via INTRAVENOUS
  Filled 2015-04-10: qty 1

## 2015-04-10 MED ORDER — OXYCODONE-ACETAMINOPHEN 5-325 MG PO TABS: 1.0000 | ORAL_TABLET | ORAL | Status: DC | PRN

## 2015-04-10 MED ORDER — OXYCODONE-ACETAMINOPHEN 5-325 MG PO TABS
2.0000 | ORAL_TABLET | Freq: Once | ORAL | Status: AC
  Administered 2015-04-10: 2 via ORAL
  Filled 2015-04-10: qty 2

## 2015-04-10 MED ORDER — ONDANSETRON 4 MG PO TBDP: 4.0000 mg | ORAL_TABLET | Freq: Three times a day (TID) | ORAL | Status: DC | PRN

## 2015-04-10 NOTE — ED Provider Notes (Signed)
CSN: [id number]     Arrival date & time 04/10/15  0548 History   First MD Initiated Contact with Patient 04/10/15 [phone]     Chief Complaint  Patient presents with  . Flank Pain  . Groin Pain     (Consider location/radiation/quality/duration/timing/severity/associated sxs/prior Treatment) HPI Comments: 29 year old male, history of kidney stones recurrent in the past who has required lithotripsy and a nephrolithotomy, presents with acute onset of left-sided flank pain that radiates to the groin, this was acute in onset, the pain is waxing and waning, associated with nausea and occasional diaphoresis. He denies any dysuria, hematuria, headache, neck pain, diarrhea. At this time his symptoms are moderate, he did have improvement with morphine on arrival.  Patient is a 29 y.o. male presenting with flank pain and groin pain. The history is provided by the patient.  Flank Pain  Groin Pain    Past Medical History  Diagnosis Date  . Renal calculus     left pelvic   Past Surgical History  Procedure Laterality Date  . Lithotrispy  2007  . Nephrolithotomy  05/07/2012    Procedure: NEPHROLITHOTOMY PERCUTANEOUS;  Surgeon: Lindaann SloughMarc-Henry Nesi, MD;  Location: [facility];  Service: Urology;  Laterality: Left;   No family history on file. History  Substance Use Topics  . Smoking status: Never Smoker   . Smokeless tobacco: Never Used  . Alcohol Use: No    Review of Systems  Genitourinary: Positive for flank pain.  All other systems reviewed and are negative.     Allergies  Review of patient's allergies indicates no known allergies.  Home Medications   Prior to Admission medications   Medication Sig Start Date End Date Taking? Authorizing Provider  promethazine (PHENERGAN) 25 MG tablet Take 1 tablet (25 mg total) by mouth every 6 (six) hours as needed for nausea or vomiting. 05/05/14  Yes Arthor CaptainAbigail Harris, PA-C  Hydrocodone-Acetaminophen (VICODIN PO) Take 1 tablet by mouth once.    Historical  Provider, MD  ondansetron (ZOFRAN ODT) 4 MG disintegrating tablet Take 1 tablet (4 mg total) by mouth every 8 (eight) hours as needed for nausea. 04/10/15   Eber HongBrian Makaylynn Bonillas, MD  oxyCODONE-acetaminophen (PERCOCET) 5-325 MG per tablet Take 1 tablet by mouth every 4 (four) hours as needed. 04/10/15   Eber HongBrian Daylin Eads, MD  oxyCODONE-acetaminophen (PERCOCET/ROXICET) 5-325 MG per tablet 1 to 2 tabs PO q6hrs  PRN for pain Patient not taking: Reported on 04/10/2015 10/10/14   Joni ReiningNicole Pisciotta, PA-C  promethazine (PHENERGAN) 25 MG tablet Take 1 tablet (25 mg total) by mouth every 6 (six) hours as needed for nausea or vomiting. 04/10/15   Eber HongBrian Kayman Snuffer, MD  tamsulosin (FLOMAX) 0.4 MG CAPS capsule Take 1 capsule (0.4 mg total) by mouth 2 (two) times daily. 04/10/15   Eber HongBrian Armin Yerger, MD   BP 134/71 mmHg  Pulse 75  Temp(Src) 98.8 F (37.1 C) (Oral)  Resp 16  SpO2 98% Physical Exam  Constitutional: He appears well-developed and well-nourished. No distress.  HENT:  Head: Normocephalic and atraumatic.  Mouth/Throat: Oropharynx is clear and moist. No oropharyngeal exudate.  Eyes: Conjunctivae and EOM are normal. Pupils are equal, round, and reactive to light. Right eye exhibits no discharge. Left eye exhibits no discharge. No scleral icterus.  Neck: Normal range of motion. Neck supple. No JVD present. No thyromegaly present.  Cardiovascular: Normal rate, regular rhythm, normal heart sounds and intact distal pulses.  Exam reveals no gallop and no friction rub.   No murmur heard. Pulmonary/Chest: Effort normal and  breath sounds normal. No respiratory distress. He has no wheezes. He has no rales.  Abdominal: Soft. Bowel sounds are normal. He exhibits no distension and no mass. There is no tenderness.  Minimal left CVA tenderness, no reproducible abdominal tenderness or guarding  Musculoskeletal: Normal range of motion. He exhibits no edema or tenderness.  Lymphadenopathy:    He has no cervical adenopathy.  Neurological:  He is alert. Coordination normal.  Skin: Skin is warm and dry. No rash noted. No erythema.  Psychiatric: He has a normal mood and affect. His behavior is normal.  Nursing note and vitals reviewed.   ED Course  Procedures (including critical care time) Labs Review Labs Reviewed  COMPREHENSIVE METABOLIC PANEL - Abnormal; Notable for the following:    Glucose, Bld 105 (*)    Total Bilirubin 3.1 (*)    All other components within normal limits  URINALYSIS, ROUTINE W REFLEX MICROSCOPIC - Abnormal; Notable for the following:    Color, Urine AMBER (*)    APPearance CLOUDY (*)    Hgb urine dipstick LARGE (*)    Ketones, ur 15 (*)    Protein, ur 30 (*)    All other components within normal limits  I-STAT CHEM 8, ED - Abnormal; Notable for the following:    Glucose, Bld 105 (*)    All other components within normal limits  CBC WITH DIFFERENTIAL/PLATELET  LIPASE, BLOOD  URINE MICROSCOPIC-ADD ON    Imaging Review Ct Abdomen Pelvis Wo Contrast  04/10/2015   CLINICAL DATA:  Left flank pain radiating to left inguinal region  EXAM: CT ABDOMEN AND PELVIS WITHOUT CONTRAST  TECHNIQUE: Multidetector CT imaging of the abdomen and pelvis was performed following the standard protocol without oral or intravenous contrast material administration.  COMPARISON:  October 10, 2014  FINDINGS: Lung bases are clear.  Liver is prominent, measuring 18.4 cm in length. No focal liver lesions are identified on this noncontrast enhanced study. Gallbladder wall is not thickened. There is no biliary duct dilatation.  Spleen, pancreas, and adrenals appear normal. On the right, there are two adjacent upper to midportion nonobstructing calculi. One of these calculi measures 3 mm, and the other measures 2 mm in size. More inferiorly, there is a 2 mm calculus on the right. There is no right renal mass or hydronephrosis. There is no right sided ureteral calculus. On the left, there is moderately severe hydronephrosis. There is a 5  mm calculus in the mid left kidney, nonobstructing. There is a calculus in the left ureteropelvic junction region measuring 8 x 5 mm. No other left ureteral calculus is seen.  In the pelvis, urinary bladder is midline with normal wall thickness. There is no pelvic mass or pelvic fluid collection. There are occasional sigmoid diverticula without diverticulitis.  The appendix appears normal.  There is no bowel obstruction. No free air or portal venous air. There is no appreciable ascites, adenopathy by size criteria, or abscess in the abdomen or pelvis. There are several subcentimeter retroperitoneal/periaortic lymph nodes of doubtful clinical significance. The aorta appears unremarkable. There are no blastic or lytic bone lesions.  IMPRESSION: 8 x 5 mm calculus in the left ureteropelvic junction causing moderately severe hydronephrosis on the left. There are nonobstructing calculi in each kidney.  Appendix appears normal. No bowel obstruction. No abscess. Liver prominent without focal lesion on this noncontrast enhanced study.   Electronically Signed   By: Bretta Bang III M.D.   On: 04/10/2015 07:54     EKG Interpretation  None      MDM   Final diagnoses:  Flank pain  Kidney stone    The patient appears somewhat uncomfortable he has a nontender abdomen, this furthers the diagnosis of kidney stones, vital signs are unremarkable, labs show normal renal function, urinalysis added, CT scan ordered.  Bedside ultrasound shows  hydronephrosis on the left, CT scan confirms an 8 mm x 5 mm kidney stone, there is moderately severe hydronephrosis, his kidney function is preserved based on lab work, urinalysis pending, discussed with patient at length the need for urological intervention as a most likely maneuver to relieve the obstruction, he refuses admission to the hospital and wants to follow-up with his urologist on Monday, he is an established patient of Dr. Brunilda Payor, at this time the patient has good pain  control and requests discharge. Return precautions given.   UA reviewed, no infection   Meds given in ED:  Medications  ketorolac (TORADOL) 30 MG/ML injection 30 mg (30 mg Intravenous Given 04/10/15 0639)  morphine 4 MG/ML injection 4 mg (4 mg Intravenous Given 04/10/15 0637)  sodium chloride 0.9 % bolus 1,000 mL (0 mLs Intravenous Stopped 04/10/15 0705)  ketorolac (TORADOL) 30 MG/ML injection 30 mg (30 mg Intravenous Given 04/10/15 0730)  HYDROmorphone (DILAUDID) injection 1 mg (1 mg Intravenous Given 04/10/15 0730)  ondansetron (ZOFRAN) injection 4 mg (4 mg Intravenous Given 04/10/15 0730)  oxyCODONE-acetaminophen (PERCOCET/ROXICET) 5-325 MG per tablet 2 tablet (2 tablets Oral Given 04/10/15 1000)    New Prescriptions   ONDANSETRON (ZOFRAN ODT) 4 MG DISINTEGRATING TABLET    Take 1 tablet (4 mg total) by mouth every 8 (eight) hours as needed for nausea.   OXYCODONE-ACETAMINOPHEN (PERCOCET) 5-325 MG PER TABLET    Take 1 tablet by mouth every 4 (four) hours as needed.   PROMETHAZINE (PHENERGAN) 25 MG TABLET    Take 1 tablet (25 mg total) by mouth every 6 (six) hours as needed for nausea or vomiting.   TAMSULOSIN (FLOMAX) 0.4 MG CAPS CAPSULE    Take 1 capsule (0.4 mg total) by mouth 2 (two) times daily.      Eber Hong, MD 04/10/15 1012

## 2015-04-10 NOTE — Discharge Instructions (Signed)
Your exam and or your xrays have shown that you likely have a kidney stone.  You should follow up with the Urologist of your choosing or the Urologist listed above in the next 2-3 days if you have not passed the stone.  You should urinate in to the strainer until you pass the stone.    Flomax helps with passing the stone by opening up the Ureters (tubes), Vicodin and an antiinflammatory for pain if you are not allergic to these medicines.  Phenergan or Zofran for nausea.  Return to the ER for severe or worsening pain, vomiting or fevers or if you are unable to control your pain with the medicines provided.  Kidney Stones Kidney stones (ureteral lithiasis) are deposits that form inside your kidneys. The intense pain is caused by the stone moving through the urinary tract. When the stone moves, the ureter goes into spasm around the stone. The stone is usually passed in the urine.  CAUSES  A disorder that makes certain neck glands produce too much parathyroid hormone (primary hyperparathyroidism).  A buildup of uric acid crystals.  Narrowing (stricture) of the ureter.  A kidney obstruction present at birth (congenital obstruction).  Previous surgery on the kidney or ureters.  Numerous kidney infections.  SYMPTOMS  Feeling sick to your stomach (nauseous).  Throwing up (vomiting).  Blood in the urine (hematuria).  Pain that usually spreads (radiates) to the groin.  Frequency or urgency of urination.  DIAGNOSIS  Taking a history and physical exam.  Blood or urine tests.  Computerized X-ray scan (CT scan).  Occasionally, an examination of the inside of the urinary bladder (cystoscopy) is performed.  TREATMENT  Observation.  Increasing your fluid intake.  Surgery may be needed if you have severe pain or persistent obstruction.  The size, location, and chemical composition are all important variables that will determine the proper choice of action for you. Talk to your caregiver to better  understand your situation so that you will minimize the risk of injury to yourself and your kidney.  HOME CARE INSTRUCTIONS  Drink enough water and fluids to keep your urine clear or pale yellow.  Strain all urine through the provided strainer. Keep all particulate matter and stones for your caregiver to see. The stone causing the pain may be as small as a grain of salt. It is very important to use the strainer each and every time you pass your urine. The collection of your stone will allow your caregiver to analyze it and verify that a stone has actually passed.  Only take over-the-counter or prescription medicines for pain, discomfort, or fever as directed by your caregiver.  Make a follow-up appointment with your caregiver as directed.  Get follow-up X-rays if required. The absence of pain does not always mean that the stone has passed. It may have only stopped moving. If the urine remains completely obstructed, it can cause loss of kidney function or even complete destruction of the kidney. It is your responsibility to make sure X-rays and follow-ups are completed. Ultrasounds of the kidney can show blockages and the status of the kidney. Ultrasounds are not associated with any radiation and can be performed easily in a matter of minutes.  SEEK IMMEDIATE MEDICAL CARE IF:  Pain cannot be controlled with the prescribed medicine.  You have a fever.  The severity or intensity of pain increases over 18 hours and is not relieved by pain medicine.  You develop a new onset of abdominal pain.  You   feel faint or pass out.  MAKE SURE YOU:  Understand these instructions.  Will watch your condition.  Will get help right away if you are not doing well or get worse.  Document Released: 11/27/2005 Document Revised: 11/16/2011 Document Reviewed: 03/25/2010 ExitCare Patient Information 2012 [employer].  RESOURCE GUIDE  Chronic Pain Problems: Contact [facility]  [phone] Patients  need to be referred by their primary care doctor.  Insufficient Money for Medicine: Contact United Way:  call "211" or [facility] 271-5999.  No Primary Care Doctor: Call [facility]  [phone] - can help you locate a primary care doctor that  accepts your insurance, provides certain services, etc. Physician Referral Service- 1-800-533-3463  Agencies that provide inexpensive medical care: [facility]  [phone] [facility]  [phone] [facility]  [phone] [facility]  [phone] [facility]  [phone] [facility]  [phone]  Medicaid-accepting [place] Providers: [facility]- [address]  [phone], Mon-Fri 9am-7pm, Sat 9am-1pm [facility]- [address], [address]  [phone] [facility]- [address], [address]  [phone] [facility]- [address] [place] [address]  [phone] [facility]- [address], [address], [phone]  Only accepts [place] Access Medicaid patients after they have their name  applied to their card  Self Pay (no insurance) in [place]: Sickle Cell Patients: Dr Eric Dean, [facility]  [address], [phone] [facility]- [address]  [phone]       -     [facility]- [address]       -     [facility]- see information above (Speak to Pam H if you do not have insurance)       -  [facility]- [address], [phone]       -  [facility] [place]- [address],  [phone]       -  [facility]- [address] [place] [address], [phone]       -  Dr Osei-Bonsu-  [address], [place], [phone]       -  [facility]- [address], [phone]       -  [facility]- [address] [place] [address], [phone], also [address], [phone]        -    [facility]- [address], [phone], 1st & 3rd Saturday   every month, 10am-1pm  1) Find a Doctor and Pay Out of Pocket Although you won't have to find out who is covered by your insurance plan, it is a good idea to ask around and get recommendations. You will then need to call the office and see if the doctor you have chosen will accept you as a new patient and what types of options they offer for patients who are self-pay. Some doctors offer discounts or will set up payment plans for their patients who do not have insurance, but you will need to ask so you aren't surprised when you get to your appointment.  2) Contact Your Local Health Department Not all health departments have doctors that can see patients for sick visits, but many do, so it is worth a call to see if yours does. If   you don't know where your local health department is, you can check in your phone book. The CDC also has a tool to help you locate your state's health department, and many state websites also have listings of all of their local health departments.  3) Find a Walk-in Clinic If your illness is not likely to be very severe or complicated, you may want to try a walk in clinic. These are popping up all over the country in pharmacies, drugstores, and shopping centers. They're usually staffed by nurse practitioners or physician assistants that have been trained to treat common illnesses and complaints. They're usually fairly quick and inexpensive. However, if you have serious medical issues or chronic medical problems, these are probably not your best option  STD Testing [place] [facility] [place], [facility], [address], [place], phone [phone] or [phone].  Monday - Friday, call for an appointment. [place] [facility] [place], [facility], [address], [place], phone [phone] or [phone].  Monday - Friday, call for an  appointment.  Abuse/Neglect: [place] Child Abuse Hotline (336) 641-3795 [place] Child Abuse Hotline 800-378-5315 (After Hours)  Emergency Shelter:  [facility] (336) 271-5985  Maternity Homes: [facility] (336) 275-9566 [facility] (704) 372-4663  MRSA Hotline #:   832-7006  [place] Resources  [facility] [place]  [employer] [place] [facility] [address]                 [address]         [address]  [place]                                               [place]                              [place] Phone:  [phone]                                  Phone:  [phone]                   Phone:  [phone]  [place] [facility], [phone] [place] [facility] - [facility]- 1-888-581-9988       -     [facility] in , [address],                                  336-349-4454, Insurance  [place] Child Abuse Hotline (336) 342-1394 or (336) 342-3537 (After Hours)   Behavioral Health Services  Substance Abuse Resources: [facility]  336-882-2125 [facility] 336-784-9470 [facility] 336-285-9073 [facility] 336-845-3988 Residential & Outpatient Substance Abuse Program  800-659-3381  Psychological Services: [facility]  832-9600 [facility]  [phone] [facility] [facility], [address], [place], ACCESS LINE: 1-800-853-5163 or 336-641-4981, Http://www.[facility].com/services/adult.htm  Dental Assistance  If unable to pay or uninsured, contact:  [facility] or [place] [facility] to become qualified for the adult dental clinic.  Patients   with Medicaid: [facility] [address], [phone], [phone]  If unable to pay, or uninsured, contact  [facility] (271-5999) or [place] [facility] ([phone] in [place], [phone] in [place]) to become qualified for the adult dental clinic  Other Low-Cost Community Dental Services: [facility]- [address], [place], [place], [address], [phone], Ext. [address], 2nd and 4th Thursday of the month at 6:30am.  10 clients each day by appointment, can sometimes see walk-in patients if someone does not show for an appointment. [facility]- [address], [place], [place], [address], [phone] [facility]- [address], [place], [place], [address], [phone] [place] [facility]- [phone] [facility]- [phone] [facility]- [phone]      

## 2015-04-10 NOTE — ED Notes (Signed)
Patient transported to CT 

## 2015-04-10 NOTE — ED Notes (Signed)
Pt aware that a urine sample is needed. Pt has urinal and will press call bell when finish.

## 2015-04-10 NOTE — ED Notes (Signed)
Aware urine sample is needed. 

## 2015-04-10 NOTE — ED Notes (Signed)
Pt from home c/o left flank that radiates to left groin. Denies urinary issues. Hx of Kidney stones

## 2015-04-14 ENCOUNTER — Other Ambulatory Visit: Payer: Self-pay | Admitting: Urology

## 2015-04-14 ENCOUNTER — Encounter (HOSPITAL_COMMUNITY): Payer: Self-pay | Admitting: *Deleted

## 2015-04-14 DIAGNOSIS — N201 Calculus of ureter: Secondary | ICD-10-CM | POA: Diagnosis not present

## 2015-04-14 NOTE — H&P (Signed)
History of Present Illness [name] was seen in the ER last Saturday with sudden onset of severe left flank pain associated with nausea and vomiting. He had been having mild left flank discomfort for about a week prior to that episode of severe pain. CT scan showed an 8 x 5 mm left proximal ureter with moderate to severe hydronephrosis. He also has bilateral non obstructing renal calculi. He has had mild discomfort since. He is not in any pain at this time. He had left PCNL for a large renal pelvis stone in May 2013 and ESL right right renal stone in June 2015. He did not return for follow-up after ESL. He states that he was doing well until last week-end.   Past Medical History Problems  1. History of kidney stones (Z61.096(Z87.442)  Surgical History Problems  1. History of Lithotripsy 2. History of Lithotripsy 3. History of Lithotripsy  Current Meds 1. No Reported Medications Recorded  Allergies Medication  1. No Known Drug Allergies  Family History Problems  1. Family history of Family Health Status - Father's Age   28 2. Family history of Family Health Status - Mother's Age   10354 3. Family history of Nephrolithiasis 4. No pertinent family history : Mother  Social History Problems  1. Denied: History of Alcohol Use 2. Denied: History of Caffeine Use 3. Marital History - Single 4. Never A Smoker 5. Occupation:   Marketing executivehift manager  Review of Systems Genitourinary, constitutional, skin, eye, otolaryngeal, hematologic/lymphatic, cardiovascular, pulmonary, endocrine, musculoskeletal, gastrointestinal, neurological and psychiatric system(s) were reviewed and pertinent findings if present are noted and are otherwise negative.  Gastrointestinal: nausea, vomiting and flank pain.    Vitals Vital Signs [Data Includes: Last 1 Day]  Recorded: 03May2016 10:27AM  Blood Pressure: 127 / 81 Heart Rate: 69 Respiration: 18  Physical Exam Constitutional: Well nourished and well developed  . No acute distress.  ENT:. The ears and nose are normal in appearance.  Neck: The appearance of the neck is normal and no neck mass is present.  Pulmonary: No respiratory distress and normal respiratory rhythm and effort.  Cardiovascular: Heart rate and rhythm are normal . No peripheral edema.  Abdomen: The abdomen is soft and nontender. No masses are palpated. No CVA tenderness. No hernias are palpable. No hepatosplenomegaly noted.  Genitourinary: Examination of the penis demonstrates no discharge, no masses, no lesions and a normal meatus. The scrotum is without lesions. The right epididymis is palpably normal and non-tender. The left epididymis is palpably normal and non-tender. The right testis is non-tender and without masses. The left testis is non-tender and without masses.  Lymphatics: The femoral and inguinal nodes are not enlarged or tender.  Skin: Normal skin turgor, no visible rash and no visible skin lesions.  Neuro/Psych:. Mood and affect are appropriate.    Results/Data  I reviewed the CT scan and the findings are as noted above.  13 Apr 2015 10:20 AM  UA With REFLEX    COLOR YELLOW     APPEARANCE CLEAR     SPECIFIC GRAVITY 1.025     pH 5.5     GLUCOSE NEG     BILIRUBIN NEG     KETONE NEG     BLOOD LARGE     PROTEIN NEG     UROBILINOGEN 0.2     NITRITE NEG     LEUKOCYTE ESTERASE NEG     SQUAMOUS EPITHELIAL/HPF RARE     WBC 0-2     CRYSTALS NONE  SEEN     CASTS NONE SEEN     RBC 11-20     BACTERIA RARE     Assessment Assessed  1. Calculus of proximal left ureter (N20.1) 2. Kidney stone on left side (N20.0) 3. Kidney stone on right side (N20.0)  Plan Kidney stone on left side  1. KUB; Status:Hold For - Date of Service; Requested for:26May2016;  2. Follow-up Office  Follow-up  Status: Hold For - Date of Service  Requested for:  26May2016  I discussed the treatment options with him: ureteroscopy versus ESL. The risks, benefits of each option were reviewed in  detail. He would like to proceed with ESL. The risks of ESL include but are not limited to hemorrhage, renal or perirenal hematoma, injury to adjacent organs, inability to fragment the stone, steinstrasse. He understands.   Signatures Electronically signed by : Su GrandMarc Nesi, M.D.; Apr 14 2015 12:35PM EST

## 2015-04-15 ENCOUNTER — Ambulatory Visit (HOSPITAL_COMMUNITY)
Admission: RE | Admit: 2015-04-15 | Discharge: 2015-04-15 | Disposition: A | Discharge status: Home / Self Care | Payer: BLUE CROSS/BLUE SHIELD | Source: Ambulatory Visit | Attending: Urology | Admitting: Urology

## 2015-04-15 ENCOUNTER — Encounter (HOSPITAL_COMMUNITY): Payer: Self-pay | Admitting: General Practice

## 2015-04-15 ENCOUNTER — Ambulatory Visit (HOSPITAL_COMMUNITY): Payer: BLUE CROSS/BLUE SHIELD

## 2015-04-15 ENCOUNTER — Encounter (HOSPITAL_COMMUNITY)
Admission: RE | Disposition: A | Discharge status: Home / Self Care | Payer: Self-pay | Source: Ambulatory Visit | Attending: Urology

## 2015-04-15 DIAGNOSIS — N201 Calculus of ureter: Secondary | ICD-10-CM | POA: Diagnosis not present

## 2015-04-15 SURGERY — LITHOTRIPSY, ESWL
Anesthesia: LOCAL | Laterality: Left

## 2015-04-15 MED ORDER — CIPROFLOXACIN HCL 500 MG PO TABS
500.0000 mg | ORAL_TABLET | ORAL | Status: AC
  Administered 2015-04-15: 500 mg via ORAL
  Filled 2015-04-15: qty 1

## 2015-04-15 MED ORDER — SODIUM CHLORIDE 0.9 % IV SOLN
INTRAVENOUS | Status: DC
  Administered 2015-04-15: 07:00:00 via INTRAVENOUS

## 2015-04-15 MED ORDER — DIPHENHYDRAMINE HCL 25 MG PO CAPS
25.0000 mg | ORAL_CAPSULE | ORAL | Status: AC
  Administered 2015-04-15: 25 mg via ORAL
  Filled 2015-04-15: qty 1

## 2015-04-15 MED ORDER — DIAZEPAM 5 MG PO TABS
10.0000 mg | ORAL_TABLET | ORAL | Status: AC
  Administered 2015-04-15: 10 mg via ORAL
  Filled 2015-04-15: qty 2

## 2015-04-15 NOTE — Op Note (Signed)
Please see scanned operative note for ESWL procedure.

## 2015-04-15 NOTE — Interval H&P Note (Signed)
History and Physical Interval Note:  04/15/2015 8:56 AM  Darryl Chavez  has presented today for surgery, with the diagnosis of LEFT PROXIMAL URETERAL CALCULUS  The various methods of treatment have been discussed with the patient and family. After consideration of risks, benefits and other options for treatment, the patient has consented to  Procedure(s): LEFT EXTRACORPOREAL SHOCK WAVE LITHOTRIPSY (ESWL) (Left) as a surgical intervention .  The patient's history has been reviewed, patient examined, no change in status, stable for surgery.  I have reviewed the patient's chart and labs.  Questions were answered to the patient's satisfaction.     Nelida Mandarino,LES

## 2015-04-15 NOTE — Discharge Instructions (Signed)
1. You should strain your urine and collect all fragments and bring them to your follow up appointment.  °2. You should take your pain medication as needed.  Please call if your pain is severe to the point that it is not controlled with your pain medication. °3. You should call if you develop fever > 101 or persistent nausea or vomiting. °4. Your doctor may prescribe tamsulosin to take to help facilitate stone passage. °

## 2015-05-25 IMAGING — CT CT ABD-PELV W/O CM
2 of 3 series · 16 of 39 positions shown, 18 images · non-contrast
Comparison: October 10, 2014

CLINICAL DATA: Left flank pain radiating to left inguinal region

EXAM:
CT ABDOMEN AND PELVIS WITHOUT CONTRAST
TECHNIQUE: Multidetector CT imaging of the abdomen and pelvis was performed
following the standard protocol without oral or intravenous contrast
material administration.

[Series 4: lung · axial · 0.74mm/px · z∈[-218,-108]mm · 13 of 25 slices shown, 15 images]
[im 2/25  soft-tissue]
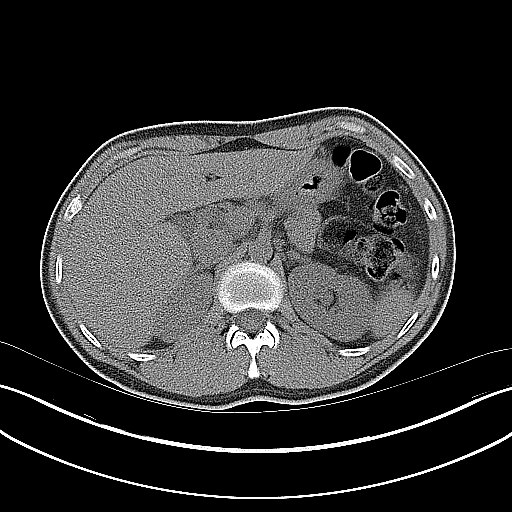
[im 2/25  bone]
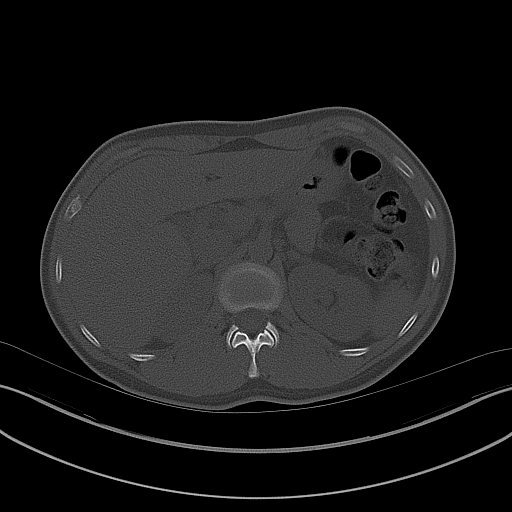
[im 4/25  soft-tissue]
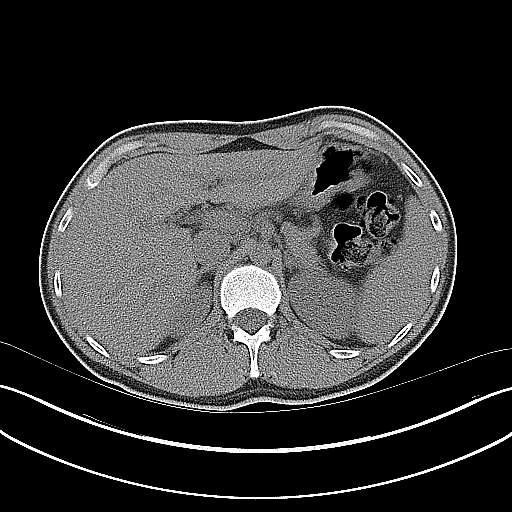
[im 6/25  soft-tissue]
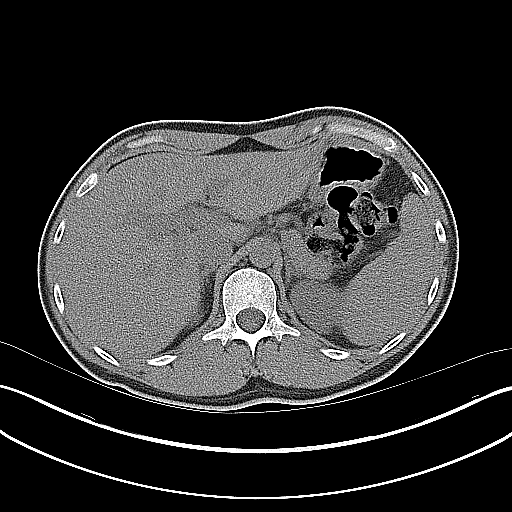
[im 8/25  soft-tissue]
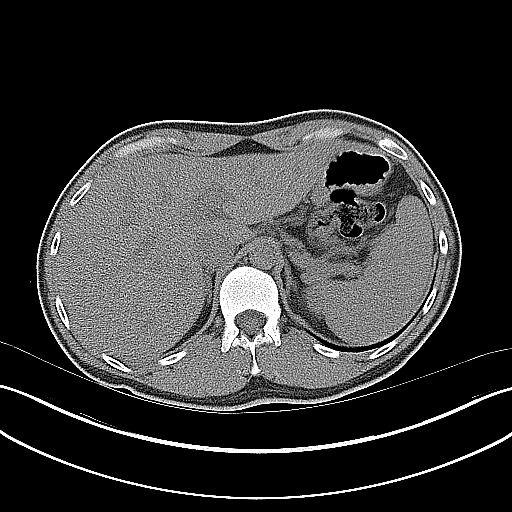
[im 9/25  soft-tissue]
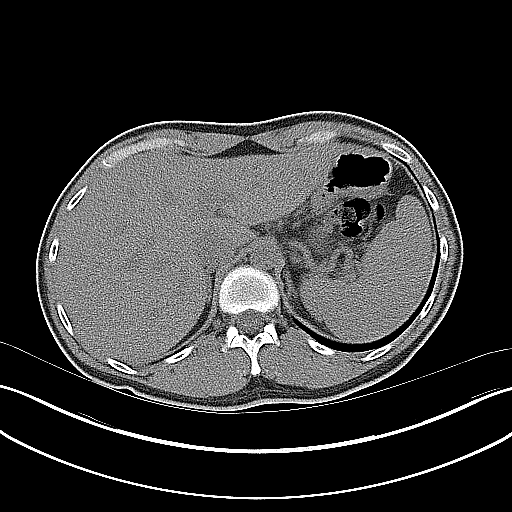
[im 11/25  soft-tissue]
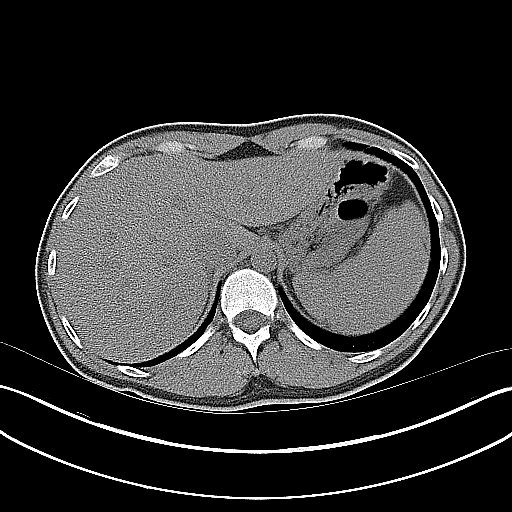
[im 13/25  soft-tissue]
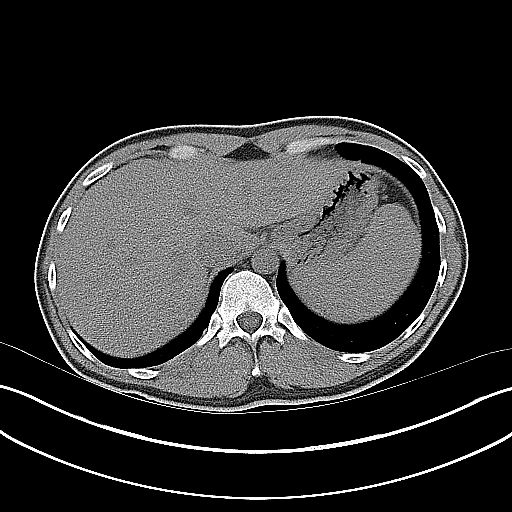
[im 15/25  soft-tissue]
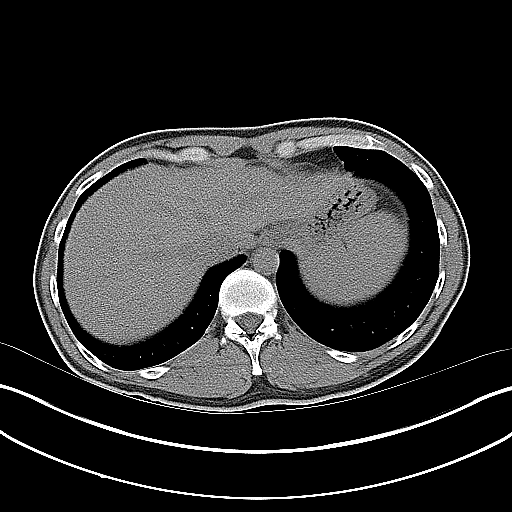
[im 17/25  soft-tissue]
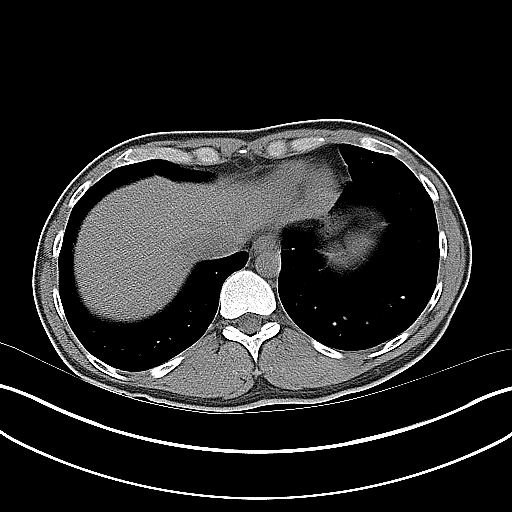
[im 17/25  bone]
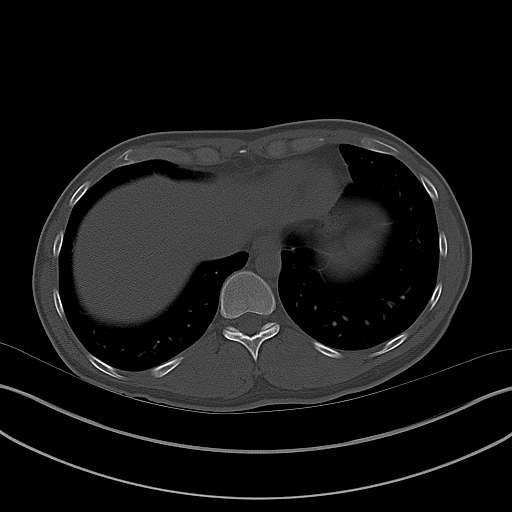
[im 18/25  soft-tissue]
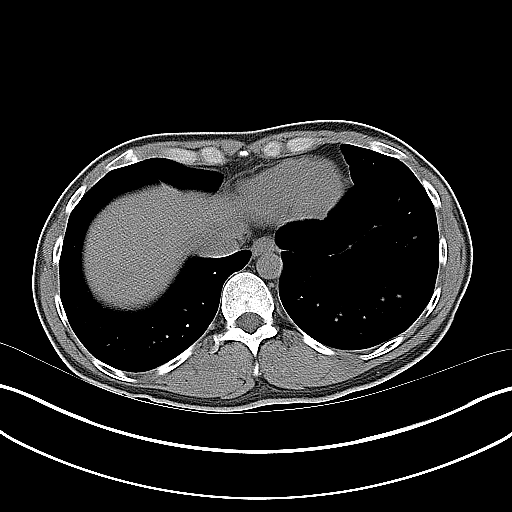
[im 20/25  soft-tissue]
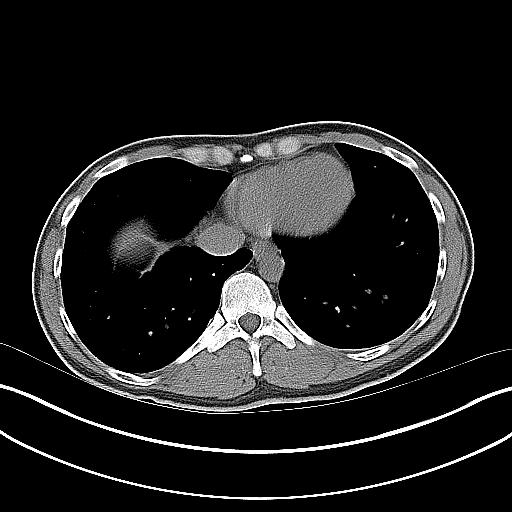
[im 22/25  soft-tissue]
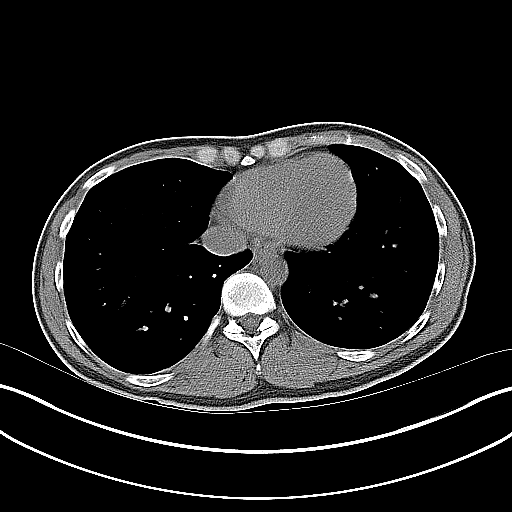
[im 24/25  soft-tissue]
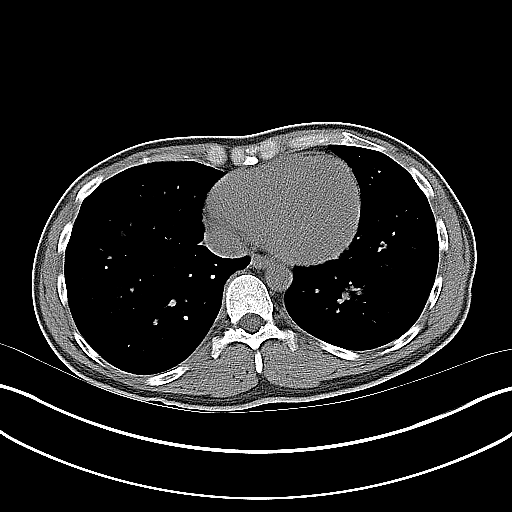

[Series 5: coronal · coronal · 0.74mm/px · 3 of 74 slices shown]
[im 25/74  soft-tissue]
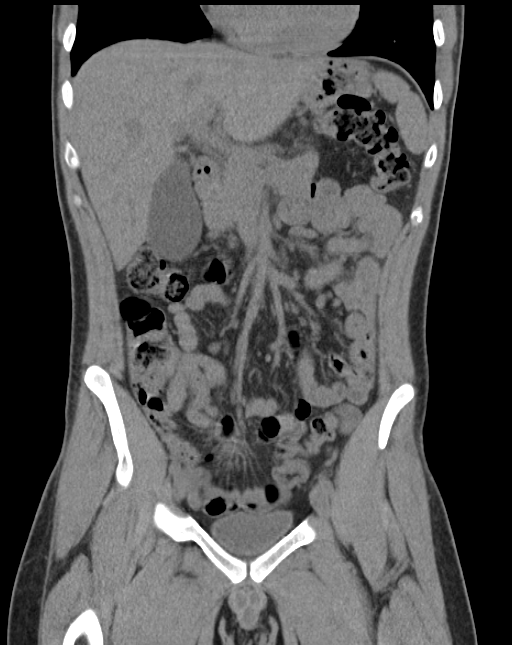
[im 33/74  soft-tissue]
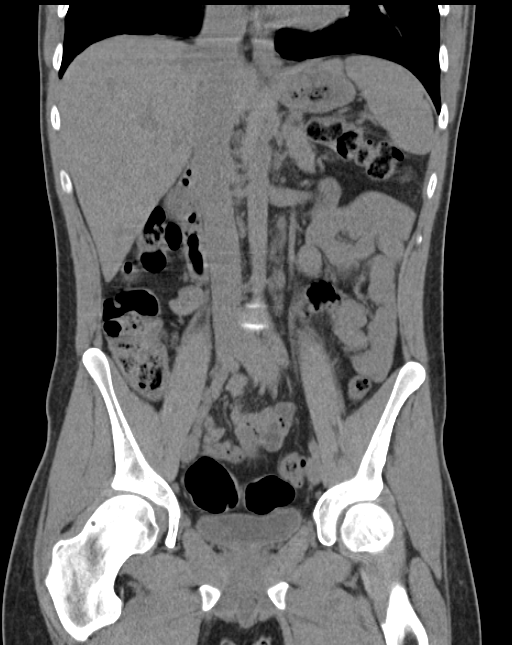
[im 41/74  soft-tissue]
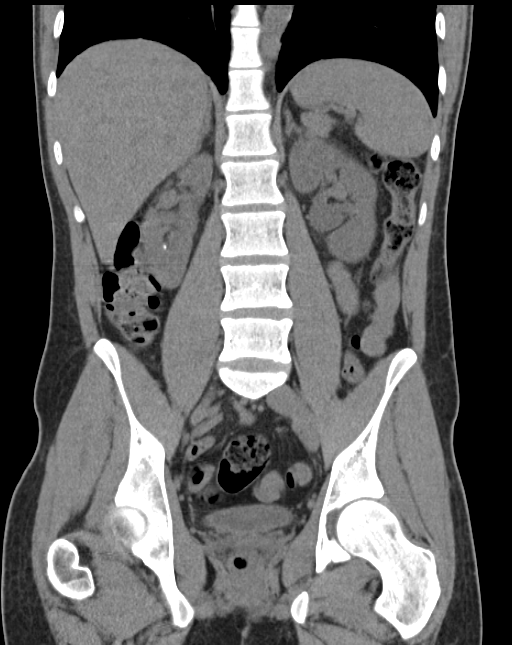

[16 of 39 positions shown; findings below may reference images not displayed]

FINDINGS: Lung bases are clear.

Liver is prominent, measuring 18.4 cm in length. No focal liver
lesions are identified on this noncontrast enhanced study.
Gallbladder wall is not thickened. There is no biliary duct
dilatation.

Spleen, pancreas, and adrenals appear normal. On the right, there
are two adjacent upper to midportion nonobstructing calculi. One of
these calculi measures 3 mm, and the other measures 2 mm in size.
More inferiorly, there is a 2 mm calculus on the right. There is no
right renal mass or hydronephrosis. There is no right sided ureteral
calculus. On the left, there is moderately severe hydronephrosis.
There is a 5 mm calculus in the mid left kidney, nonobstructing.
There is a calculus in the left ureteropelvic junction region
measuring 8 x 5 mm. No other left ureteral calculus is seen.

In the pelvis, urinary bladder is midline with normal wall
thickness. There is no pelvic mass or pelvic fluid collection. There
are occasional sigmoid diverticula without diverticulitis.

The appendix appears normal.

There is no bowel obstruction. No free air or portal venous air.
There is no appreciable ascites, adenopathy by size criteria, or
abscess in the abdomen or pelvis. There are several subcentimeter
retroperitoneal/periaortic lymph nodes of doubtful clinical
significance. The aorta appears unremarkable. There are no blastic
or lytic bone lesions.
IMPRESSION: 8 x 5 mm calculus in the left ureteropelvic junction causing
moderately severe hydronephrosis on the left. There are
nonobstructing calculi in each kidney.

Appendix appears normal. No bowel obstruction. No abscess. Liver
prominent without focal lesion on this noncontrast enhanced study.

## 2015-05-30 IMAGING — CR DG ABDOMEN 1V
1 series · 1 of 1 positions shown · non-contrast
Comparison: CT Abdomen and Pelvis 04/10/2015 and earlier.

CLINICAL DATA: 29-year-old male with left ureteral calculus and
obstructive uropathy. Preoperative study. Initial encounter.

EXAM:
ABDOMEN - 1 VIEW

[t abdomen supine]
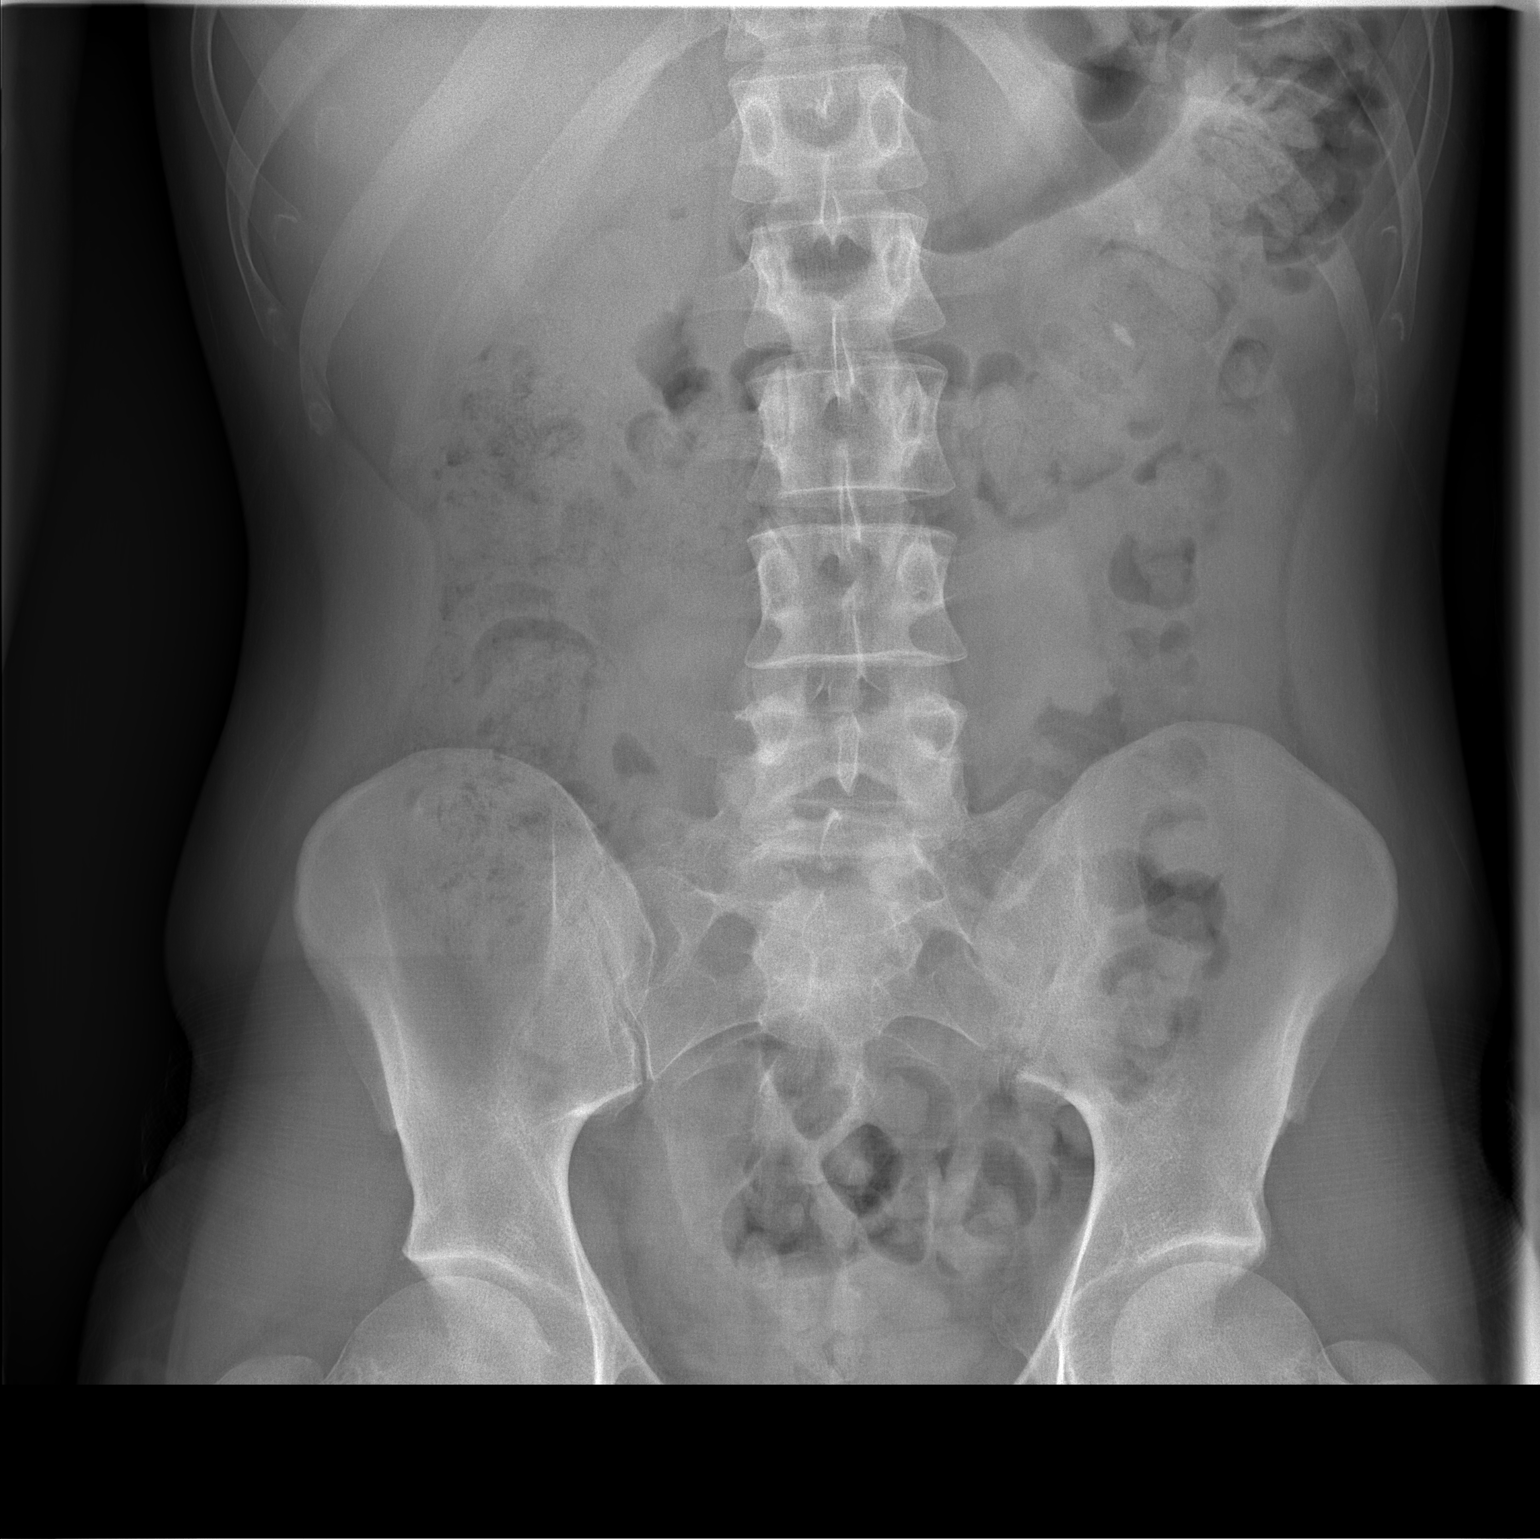

[1 of 1 positions shown; findings below may reference images not displayed]

FINDINGS: Although sizable at 6-7 mm, the left UPJ obstructing calculus was
difficult to identify on the scout view of the comparison. It
projected at the L3 transverse process level at that time. Today a
similar size calculus projects over the left renal lower pole (long
arrow). Stable 5 mm left midpole calculus. No definite ureteral
calculus today. Punctate right nephrolithiasis was better
demonstrated previously.

Stable abdominal and pelvic visceral contours. Non obstructed bowel
gas pattern. No osseous abnormality identified.
IMPRESSION: 1. Evidence that the left UPJ stone has migrated back into the left
lower pole collecting system since 04/10/2015.
[DATE]. Otherwise stable bilateral nephrolithiasis.

## 2016-05-19 ENCOUNTER — Encounter (HOSPITAL_COMMUNITY): Payer: Self-pay | Admitting: *Deleted

## 2016-05-19 ENCOUNTER — Emergency Department (HOSPITAL_COMMUNITY)
Admission: EM | Admit: 2016-05-19 | Discharge: 2016-05-19 | Disposition: A | Discharge status: Home / Self Care | Payer: BLUE CROSS/BLUE SHIELD | Attending: Emergency Medicine | Admitting: Emergency Medicine

## 2016-05-19 DIAGNOSIS — Z79899 Other long term (current) drug therapy: Secondary | ICD-10-CM | POA: Insufficient documentation

## 2016-05-19 DIAGNOSIS — N200 Calculus of kidney: Secondary | ICD-10-CM | POA: Insufficient documentation

## 2016-05-19 LAB — LIPASE, BLOOD: Lipase: 23 U/L (ref 11–51)

## 2016-05-19 LAB — COMPREHENSIVE METABOLIC PANEL
ALT: 16 U/L — AB (ref 17–63)
AST: 22 U/L (ref 15–41)
Albumin: 4.9 g/dL (ref 3.5–5.0)
Alkaline Phosphatase: 50 U/L (ref 38–126)
Anion gap: 8 (ref 5–15)
BUN: 17 mg/dL (ref 6–20)
CALCIUM: 9.4 mg/dL (ref 8.9–10.3)
CO2: 26 mmol/L (ref 22–32)
CREATININE: 0.96 mg/dL (ref 0.61–1.24)
Chloride: 104 mmol/L (ref 101–111)
GFR calc Af Amer: >60 mL/min (ref >60)
GFR calc non Af Amer: >60 mL/min (ref >60)
Glucose, Bld: 146 mg/dL — ABNORMAL HIGH (ref 65–99)
Potassium: 4.2 mmol/L (ref 3.5–5.1)
SODIUM: 138 mmol/L (ref 135–145)
Total Bilirubin: 1.5 mg/dL — ABNORMAL HIGH (ref 0.3–1.2)
Total Protein: 7.8 g/dL (ref 6.5–8.1)

## 2016-05-19 LAB — CBC
HCT: 40.9 % (ref 39.0–52.0)
Hemoglobin: 14.1 g/dL (ref 13.0–17.0)
MCH: 31 pg (ref 26.0–34.0)
MCHC: 34.5 g/dL (ref 30.0–36.0)
MCV: 89.9 fL (ref 78.0–100.0)
Platelets: 181 10*3/uL (ref 150–400)
RBC: 4.55 MIL/uL (ref 4.22–5.81)
RDW: 12.8 % (ref 11.5–15.5)
WBC: 10.3 10*3/uL (ref 4.0–10.5)

## 2016-05-19 LAB — URINALYSIS, ROUTINE W REFLEX MICROSCOPIC
BILIRUBIN URINE: NEGATIVE
GLUCOSE, UA: NEGATIVE mg/dL
KETONES UR: NEGATIVE mg/dL
Leukocytes, UA: NEGATIVE
Nitrite: NEGATIVE
PROTEIN: 30 mg/dL — AB
Specific Gravity, Urine: 1.016 (ref 1.005–1.030)
pH: 8 (ref 5.0–8.0)

## 2016-05-19 LAB — URINE MICROSCOPIC-ADD ON

## 2016-05-19 MED ORDER — ONDANSETRON 4 MG PO TBDP: 4.0000 mg | ORAL_TABLET | Freq: Three times a day (TID) | ORAL | Status: DC | PRN

## 2016-05-19 MED ORDER — TAMSULOSIN HCL 0.4 MG PO CAPS: 0.4000 mg | ORAL_CAPSULE | Freq: Two times a day (BID) | ORAL | Status: DC

## 2016-05-19 MED ORDER — KETOROLAC TROMETHAMINE 30 MG/ML IJ SOLN
30.0000 mg | Freq: Once | INTRAMUSCULAR | Status: AC
  Administered 2016-05-19: 30 mg via INTRAVENOUS
  Filled 2016-05-19: qty 1

## 2016-05-19 MED ORDER — OXYCODONE HCL 5 MG PO TABS
5.0000 mg | ORAL_TABLET | ORAL | Status: DC | PRN
Start: 2016-05-19 — End: 2017-01-29

## 2016-05-19 MED ORDER — MORPHINE SULFATE (PF) 4 MG/ML IV SOLN: 4.0000 mg | Freq: Once | INTRAVENOUS | Status: DC

## 2016-05-19 MED ORDER — ONDANSETRON 4 MG PO TBDP
4.0000 mg | ORAL_TABLET | Freq: Once | ORAL | Status: AC | PRN
  Administered 2016-05-19: 4 mg via ORAL
  Filled 2016-05-19: qty 1

## 2016-05-19 MED ORDER — SODIUM CHLORIDE 0.9 % IV BOLUS (SEPSIS)
1000.0000 mL | Freq: Once | INTRAVENOUS | Status: AC
  Administered 2016-05-19: 1000 mL via INTRAVENOUS

## 2016-05-19 NOTE — ED Notes (Signed)
Pt complains of nausea/vomiting, abdominal pain since 5PM today. Pt states he believes he has a kidney stone. Pt denies urinary symptoms.

## 2016-05-19 NOTE — Discharge Instructions (Signed)
Take your medications as prescribed as needed for pain and nausea. I recommend continuing to drink at least six 8 ounce glasses of water daily to remain hydrated at home. I recommend following up with your urology clinic for follow up within the next week.  Please return to the Emergency Department if symptoms worsen or new onset of fever, abdominal pain, worsening pain, difficulty urinating, blood in urine, vomiting, unable to keep fluids down.

## 2016-05-19 NOTE — ED Notes (Signed)
Patient states"he does not want his blood draw until he gets pain meds." Triage RN made aware.

## 2016-05-19 NOTE — ED Provider Notes (Signed)
CSN: [id number]     Arrival date & time 05/19/16  1750 History   First MD Initiated Contact with Patient 05/19/16 1950     Chief Complaint  Patient presents with  . Abdominal Pain  . Emesis     (Consider location/radiation/quality/duration/timing/severity/associated sxs/prior Treatment) HPI   Patient is a 30 year old male with past medical history of kidney stones who presents the ED with complaint of left flank pain, onset 5 PM. Patient reports having waxing and waning sharp and dull pain to his left flank that radiates around to the left side of his abdomen. He endorses having one episode of NBNB vomiting PTA. Pt reports his pain feels similar to pain he has had in the past related to his kidney stones. Denies any aggravating or alleviating factors. Denies fever, chills, urinary sxs, hematuria, diarrhea, constipation, back pain. Pt reports having multiple lithotripsies performed in the past, last one preformed in 2015.   Past Medical History  Diagnosis Date  . Renal calculus     left pelvic   Past Surgical History  Procedure Laterality Date  . Lithotrispy  2007  . Nephrolithotomy  05/07/2012    Procedure: NEPHROLITHOTOMY PERCUTANEOUS;  Surgeon: Lindaann SloughMarc-Henry Nesi, MD;  Location: [facility];  Service: Urology;  Laterality: Left;   No family history on file. Social History  Substance Use Topics  . Smoking status: Never Smoker   . Smokeless tobacco: Never Used  . Alcohol Use: No    Review of Systems  Gastrointestinal: Positive for nausea and vomiting.  Genitourinary: Positive for flank pain (left).  All other systems reviewed and are negative.     Allergies  Review of patient's allergies indicates no known allergies.  Home Medications   Prior to Admission medications   Medication Sig Start Date End Date Taking? Authorizing Provider  naproxen sodium (ANAPROX) 220 MG tablet Take 440 mg by mouth 2 (two) times daily as needed (headache).   Yes Historical Provider, MD  ondansetron  (ZOFRAN ODT) 4 MG disintegrating tablet Take 1 tablet (4 mg total) by mouth every 8 (eight) hours as needed for nausea or vomiting. 05/19/16   Barrett HenleNicole Elizabeth Gerardo Territo, PA-C  oxyCODONE (ROXICODONE) 5 MG immediate release tablet Take 1 tablet (5 mg total) by mouth every 4 (four) hours as needed for severe pain. 05/19/16   Barrett HenleNicole Elizabeth Joal Eakle, PA-C  oxyCODONE-acetaminophen (PERCOCET/ROXICET) 5-325 MG per tablet 1 to 2 tabs PO q6hrs  PRN for pain Patient not taking: Reported on 04/10/2015 10/10/14   Joni ReiningNicole Pisciotta, PA-C  promethazine (PHENERGAN) 25 MG tablet Take 1 tablet (25 mg total) by mouth every 6 (six) hours as needed for nausea or vomiting. Patient not taking: Reported on 04/14/2015 05/05/14   Arthor CaptainAbigail Harris, PA-C  tamsulosin (FLOMAX) 0.4 MG CAPS capsule Take 1 capsule (0.4 mg total) by mouth 2 (two) times daily. 05/19/16   Satira SarkNicole Elizabeth Demeisha Geraghty, PA-C   BP 107/61 mmHg  Pulse 73  Temp(Src) 98.3 F (36.8 C) (Oral)  Resp 17  SpO2 99% Physical Exam  Constitutional: He is oriented to person, place, and time. He appears well-developed and well-nourished. No distress.  HENT:  Head: Normocephalic and atraumatic.  Mouth/Throat: Oropharynx is clear and moist and mucous membranes are normal. No oropharyngeal exudate.  Eyes: Conjunctivae and EOM are normal. Pupils are equal, round, and reactive to light. Right eye exhibits no discharge. Left eye exhibits no discharge. No scleral icterus.  Neck: Normal range of motion. Neck supple.  Cardiovascular: Normal rate, regular rhythm, normal heart sounds and  intact distal pulses.   Pulmonary/Chest: Effort normal and breath sounds normal. No respiratory distress. He has no wheezes. He has no rales. He exhibits no tenderness.  Abdominal: Soft. Bowel sounds are normal. He exhibits no distension and no mass. There is no tenderness. There is no rebound and no guarding.  Left CVA tenderness  Musculoskeletal: He exhibits no edema.  Neurological: He is alert and  oriented to person, place, and time.  Skin: Skin is warm and dry. He is not diaphoretic.  Nursing note and vitals reviewed.   ED Course  Procedures (including critical care time) Labs Review Labs Reviewed  COMPREHENSIVE METABOLIC PANEL - Abnormal; Notable for the following:    Glucose, Bld 146 (*)    ALT 16 (*)    Total Bilirubin 1.5 (*)    All other components within normal limits  URINALYSIS, ROUTINE W REFLEX MICROSCOPIC (NOT AT [facility]) - Abnormal; Notable for the following:    APPearance CLOUDY (*)    Hgb urine dipstick MODERATE (*)    Protein, ur 30 (*)    All other components within normal limits  URINE MICROSCOPIC-ADD ON - Abnormal; Notable for the following:    Squamous Epithelial / LPF 0-5 (*)    Bacteria, UA FEW (*)    All other components within normal limits  LIPASE, BLOOD  CBC    Imaging Review No results found. I have personally reviewed and evaluated these images and lab results as part of my medical decision-making.   EKG Interpretation None      MDM   Final diagnoses:  Kidney stone    Patient presents with left flank pain and associated nausea and vomiting when she reports is consistent with pain he has had in the past related to his kidney stones. History of kidney stones. Denies fever or abdominal pain. VSS. Exam revealed left CVA tenderness, abdominal exam benign. Patient given IV fluids, pain meds and Zofran. Urine revealed moderate hgb, no evidence of associated infection. Remaining labs unremarkable.  Chart review shows most recent CT abdomen on 04/10/15 showed 8 x 5 mm calculus in the left ureteropelvic junction causing moderately severe hydronephrosis on the left, nonobstructing calculi seen in each kidney.  Discussed case with Dr. Particia Nearing. Due to pt having hx of multiple kidney stones and multiple CT imaging being performed over the past few years, will plan to tx pt's pain and PO challenge as exam appears to be consistent with kidney stone.  Reevaluation patient reports his pain has significantly improved. Patient able to tolerate by mouth. Discussed results and plan for discharge with patient. Discharged patient home with pain meds, symptomatic tx and antiemetics. Advised patient to follow up with his urologist within the next week. Discussed return precautions with patient.  Satira Sark [place], [place] 05/19/16 2229  Jacalyn Lefevre, MD 05/20/16 [phone]

## 2016-05-19 NOTE — Progress Notes (Signed)
EDCM spoke to patient at bedside. Patient confirms he does not have a pcp or insurance living in [place].  EDCM provided patient with contact information to [facility], informed patient of services there and walk in times.  EDCM also provided patient with list of pcps who accept self pay patients, list of discount pharmacies and websites needymeds.org and GoodRX.com for medication assistance, phone number to inquire about the orange card, phone number to inquire about Medicaid, phone number to inquire about the Affordable Care Act, financial resources in the community such as local churches, [employer], urban ministries, and dental assistance for uninsured patients.  Patient thankful for resources.  No further EDCM needs at this time. 

## 2016-11-19 ENCOUNTER — Encounter (HOSPITAL_COMMUNITY): Payer: Self-pay | Admitting: *Deleted

## 2016-11-19 ENCOUNTER — Emergency Department (HOSPITAL_COMMUNITY): Payer: BLUE CROSS/BLUE SHIELD

## 2016-11-19 ENCOUNTER — Emergency Department (HOSPITAL_COMMUNITY)
Admission: EM | Admit: 2016-11-19 | Discharge: 2016-11-20 | Disposition: A | Discharge status: Home / Self Care | Payer: BLUE CROSS/BLUE SHIELD | Attending: Emergency Medicine | Admitting: Emergency Medicine

## 2016-11-19 DIAGNOSIS — N200 Calculus of kidney: Secondary | ICD-10-CM

## 2016-11-19 DIAGNOSIS — Z79899 Other long term (current) drug therapy: Secondary | ICD-10-CM | POA: Insufficient documentation

## 2016-11-19 LAB — URINALYSIS, ROUTINE W REFLEX MICROSCOPIC
BILIRUBIN URINE: NEGATIVE
Bacteria, UA: NONE SEEN
GLUCOSE, UA: NEGATIVE mg/dL
KETONES UR: NEGATIVE mg/dL
LEUKOCYTES UA: NEGATIVE
Nitrite: NEGATIVE
PH: 5 (ref 5.0–8.0)
Protein, ur: NEGATIVE mg/dL
SPECIFIC GRAVITY, URINE: 1.021 (ref 1.005–1.030)
SQUAMOUS EPITHELIAL / LPF: NONE SEEN

## 2016-11-19 LAB — BASIC METABOLIC PANEL
ANION GAP: 6 (ref 5–15)
BUN: 21 mg/dL — ABNORMAL HIGH (ref 6–20)
CALCIUM: 9.2 mg/dL (ref 8.9–10.3)
CHLORIDE: 105 mmol/L (ref 101–111)
CO2: 27 mmol/L (ref 22–32)
CREATININE: 0.76 mg/dL (ref 0.61–1.24)
GFR calc non Af Amer: >60 mL/min (ref >60)
GLUCOSE: 100 mg/dL — AB (ref 65–99)
Potassium: 4.1 mmol/L (ref 3.5–5.1)
Sodium: 138 mmol/L (ref 135–145)

## 2016-11-19 MED ORDER — HYDROMORPHONE HCL 1 MG/ML IJ SOLN
1.0000 mg | Freq: Once | INTRAMUSCULAR | Status: AC
  Administered 2016-11-19: 1 mg via INTRAVENOUS
  Filled 2016-11-19: qty 1

## 2016-11-19 MED ORDER — ONDANSETRON 4 MG PO TBDP
4.0000 mg | ORAL_TABLET | Freq: Once | ORAL | Status: AC | PRN
  Administered 2016-11-19: 4 mg via ORAL
  Filled 2016-11-19: qty 1

## 2016-11-19 MED ORDER — ONDANSETRON HCL 4 MG PO TABS: 4.0000 mg | ORAL_TABLET | Freq: Four times a day (QID) | ORAL | 0 refills | Status: DC

## 2016-11-19 MED ORDER — OXYCODONE-ACETAMINOPHEN 5-325 MG PO TABS: 1.0000 | ORAL_TABLET | Freq: Four times a day (QID) | ORAL | 0 refills | Status: DC | PRN

## 2016-11-19 MED ORDER — TAMSULOSIN HCL 0.4 MG PO CAPS: 0.4000 mg | ORAL_CAPSULE | Freq: Every day | ORAL | 0 refills | Status: DC

## 2016-11-19 MED ORDER — ONDANSETRON HCL 4 MG/2ML IJ SOLN
4.0000 mg | Freq: Once | INTRAMUSCULAR | Status: AC
  Administered 2016-11-19: 4 mg via INTRAVENOUS
  Filled 2016-11-19: qty 2

## 2016-11-19 NOTE — ED Notes (Signed)
Patient complaining of left flank pain that radiates to his back. States the pain started on 12/9 and notice increase in pain today and urinary frequency. Denies pain anywhere else, denies fever, did state he has been nauseated and has vomiting about 4 times today. States the pain comes and goes at times. He stated [place] the beach [place] and had similar symptoms and was told he had a kidney stone at that time. Unsure of the placement.

## 2016-11-19 NOTE — ED Triage Notes (Signed)
Pt complains of left sided flank pain, nausea, vomiting today. Pt believes he has a kidney stone, pt has hx of kidney stones.

## 2016-11-19 NOTE — ED Provider Notes (Signed)
[facility]-EMER[facility]ENCY DEPT Provider Note   CSN: [id number] Arrival date & time: 11/19/16  1936     History   Chief Complaint Chief Complaint  Patient presents with  . Flank Pain    HPI Darryl Chavez is a 30 y.o. male.  HPI  Pt presenting with c/o left flank pain.  Pt has hx of renal stones.  Pain began yesterday and became worse today.  Today he also began to have nausea and vomiting.  Pain has been waxing and waning.  No fever/chills.  No hematuria.  No difficulty urinating.  There are no other associated systemic symptoms, there are no other alleviating or modifying factors. Pain is similar to his prior stones.  He has needed lithotripsy in the past by [facility].    Past Medical History:  Diagnosis Date  . Renal calculus    left pelvic    There are no active problems to display for this patient.   Past Surgical History:  Procedure Laterality Date  . lithotrispy  2007  . NEPHROLITHOTOMY  05/07/2012   Procedure: NEPHROLITHOTOMY PERCUTANEOUS;  Surgeon: Lindaann SloughMarc-Henry Nesi, MD;  Location: [facility];  Service: Urology;  Laterality: Left;       Home Medications    Prior to Admission medications   Medication Sig Start Date End Date Taking? Authorizing Provider  ondansetron (ZOFRAN ODT) 4 MG disintegrating tablet Take 1 tablet (4 mg total) by mouth every 8 (eight) hours as needed for nausea or vomiting. Patient not taking: Reported on 11/19/2016 05/19/16   Barrett HenleNicole Elizabeth Nadeau, PA-C  ondansetron (ZOFRAN) 4 MG tablet Take 1 tablet (4 mg total) by mouth every 6 (six) hours. 11/19/16   Jerelyn ScottMartha Linker, MD  oxyCODONE (ROXICODONE) 5 MG immediate release tablet Take 1 tablet (5 mg total) by mouth every 4 (four) hours as needed for severe pain. Patient not taking: Reported on 11/19/2016 05/19/16   Barrett HenleNicole Elizabeth Nadeau, PA-C  oxyCODONE-acetaminophen (PERCOCET/ROXICET) 5-325 MG tablet Take 1-2 tablets by mouth every 6 (six) hours as needed for severe pain. 11/19/16   Jerelyn ScottMartha Linker, MD    promethazine (PHENERGAN) 25 MG tablet Take 1 tablet (25 mg total) by mouth every 6 (six) hours as needed for nausea or vomiting. Patient not taking: Reported on 04/14/2015 05/05/14   Arthor CaptainAbigail Harris, PA-C  tamsulosin (FLOMAX) 0.4 MG CAPS capsule Take 1 capsule (0.4 mg total) by mouth daily. 11/19/16   Jerelyn ScottMartha Linker, MD    Family History No family history on file.  Social History Social History  Substance Use Topics  . Smoking status: Never Smoker  . Smokeless tobacco: Never Used  . Alcohol use No     Allergies   Patient has no known allergies.   Review of Systems Review of Systems  ROS reviewed and all otherwise negative except for mentioned in HPI   Physical Exam Updated Vital Signs BP 113/68 (BP Location: Left Arm)   Pulse (!) 58 Comment: Per pt this is his baseline.  Distal pulses +3  Temp 98.6 F (37 C) (Oral)   Resp 12   SpO2 100%  Vitals reviewed Physical Exam Physical Examination: General appearance - alert, well appearing, and in no distress Mental status - alert, oriented to person, place, and time Eyes - no conjunctival injection, no scleral icterus Chest - clear to auscultation, no wheezes, rales or rhonchi, symmetric air entry Heart - normal rate, regular rhythm, normal S1, S2, no murmurs, rubs, clicks or gallops Abdomen - soft, nontender, nondistended, no masses or organomegaly Back exam -  left sided CVA tenderness, no midline spinal tenderness Neurological - alert, oriented, normal speech Extremities - peripheral pulses normal, no pedal edema, no clubbing or cyanosis Skin - normal coloration and turgor, no rashes  ED Treatments / Results  Labs (all labs ordered are listed, but only abnormal results are displayed) Labs Reviewed  URINALYSIS, ROUTINE W REFLEX MICROSCOPIC - Abnormal; Notable for the following:       Result Value   Hgb urine dipstick LARGE (*)    All other components within normal limits  BASIC METABOLIC PANEL - Abnormal; Notable for the  following:    Glucose, Bld 100 (*)    BUN 21 (*)    All other components within normal limits    EKG  EKG Interpretation None       Radiology No results found.  Procedures Procedures (including critical care time)  Medications Ordered in ED Medications  ondansetron (ZOFRAN-ODT) disintegrating tablet 4 mg (4 mg Oral Given 11/19/16 1948)  HYDROmorphone (DILAUDID) injection 1 mg (1 mg Intravenous Given 11/19/16 2103)  ondansetron (ZOFRAN) injection 4 mg (4 mg Intravenous Given 11/19/16 2057)     Initial Impression / Assessment and Plan / ED Course  I have reviewed the triage vital signs and the nursing notes.  Pertinent labs & imaging results that were available during my care of the patient were reviewed by me and considered in my medical decision making (see chart for details).  Clinical Course     Pt with hx of renal stones presenting with left flank pain. Urine shows + RBCs, - not c/w UTI.  Renal ultrasound does not show hydronephrosis, renal function is reassuring.  Pt feels much improved after meds.  Advised to f/u with urology- advised not to take nsaids as he has needed lithotripsy in the past.  Discharged with strict return precautions.  Pt agreeable with plan.  Final Clinical Impressions(s) / ED Diagnoses   Final diagnoses:  Kidney stone    New Prescriptions Discharge Medication List as of 11/20/2016 12:00 AM    START taking these medications   Details  ondansetron (ZOFRAN) 4 MG tablet Take 1 tablet (4 mg total) by mouth every 6 (six) hours., Starting Sun 11/19/2016, Print         Jerelyn ScottMartha Linker, MD 11/22/16 [phone]

## 2016-11-20 NOTE — Discharge Instructions (Signed)
Return to the ED with any concerns including pain not controlled by pain medications, vomiting and not able to keep down liquids, fever/chills, decreased level of alertness/lethargy, or any other alarming symptoms  You should be sure to call for a followup appointment with urology

## 2017-01-29 ENCOUNTER — Encounter (HOSPITAL_COMMUNITY): Payer: Self-pay | Admitting: Emergency Medicine

## 2017-01-29 ENCOUNTER — Emergency Department (HOSPITAL_COMMUNITY)
Admission: EM | Admit: 2017-01-29 | Discharge: 2017-01-29 | Disposition: A | Discharge status: Home / Self Care | Payer: Self-pay | Attending: Emergency Medicine | Admitting: Emergency Medicine

## 2017-01-29 ENCOUNTER — Emergency Department (HOSPITAL_COMMUNITY): Payer: Self-pay

## 2017-01-29 DIAGNOSIS — N132 Hydronephrosis with renal and ureteral calculous obstruction: Secondary | ICD-10-CM | POA: Insufficient documentation

## 2017-01-29 DIAGNOSIS — Z79899 Other long term (current) drug therapy: Secondary | ICD-10-CM | POA: Insufficient documentation

## 2017-01-29 DIAGNOSIS — N200 Calculus of kidney: Secondary | ICD-10-CM

## 2017-01-29 LAB — COMPREHENSIVE METABOLIC PANEL
ALBUMIN: 5.1 g/dL — AB (ref 3.5–5.0)
ALK PHOS: 41 U/L (ref 38–126)
ALT: 14 U/L — ABNORMAL LOW (ref 17–63)
AST: 21 U/L (ref 15–41)
Anion gap: 8 (ref 5–15)
BILIRUBIN TOTAL: 2.4 mg/dL — AB (ref 0.3–1.2)
BUN: 12 mg/dL (ref 6–20)
CALCIUM: 9.9 mg/dL (ref 8.9–10.3)
CO2: 25 mmol/L (ref 22–32)
CREATININE: 1.2 mg/dL (ref 0.61–1.24)
Chloride: 106 mmol/L (ref 101–111)
GFR calc Af Amer: >60 mL/min (ref >60)
GLUCOSE: 120 mg/dL — AB (ref 65–99)
POTASSIUM: 3.6 mmol/L (ref 3.5–5.1)
Sodium: 139 mmol/L (ref 135–145)
TOTAL PROTEIN: 7.7 g/dL (ref 6.5–8.1)

## 2017-01-29 LAB — URINALYSIS, ROUTINE W REFLEX MICROSCOPIC
Bacteria, UA: NONE SEEN
Bilirubin Urine: NEGATIVE
GLUCOSE, UA: NEGATIVE mg/dL
Ketones, ur: 5 mg/dL — AB
Leukocytes, UA: NEGATIVE
NITRITE: NEGATIVE
PH: 5 (ref 5.0–8.0)
Protein, ur: 30 mg/dL — AB
SPECIFIC GRAVITY, URINE: 1.019 (ref 1.005–1.030)

## 2017-01-29 LAB — CBC WITH DIFFERENTIAL/PLATELET
BASOS ABS: 0 10*3/uL (ref 0.0–0.1)
Basophils Relative: 0 %
EOS ABS: 0.1 10*3/uL (ref 0.0–0.7)
EOS PCT: 1 %
HCT: 41.3 % (ref 39.0–52.0)
Hemoglobin: 14.3 g/dL (ref 13.0–17.0)
LYMPHS PCT: 15 %
Lymphs Abs: 1.6 10*3/uL (ref 0.7–4.0)
MCH: 31 pg (ref 26.0–34.0)
MCHC: 34.6 g/dL (ref 30.0–36.0)
MCV: 89.4 fL (ref 78.0–100.0)
MONO ABS: 0.9 10*3/uL (ref 0.1–1.0)
Monocytes Relative: 9 %
Neutro Abs: 8.1 10*3/uL — ABNORMAL HIGH (ref 1.7–7.7)
Neutrophils Relative %: 75 %
PLATELETS: 173 10*3/uL (ref 150–400)
RBC: 4.62 MIL/uL (ref 4.22–5.81)
RDW: 13 % (ref 11.5–15.5)
WBC: 10.7 10*3/uL — AB (ref 4.0–10.5)

## 2017-01-29 MED ORDER — KETOROLAC TROMETHAMINE 30 MG/ML IJ SOLN
30.0000 mg | Freq: Once | INTRAMUSCULAR | Status: AC
  Administered 2017-01-29: 30 mg via INTRAVENOUS
  Filled 2017-01-29: qty 1

## 2017-01-29 MED ORDER — SODIUM CHLORIDE 0.9 % IV BOLUS (SEPSIS)
1000.0000 mL | Freq: Once | INTRAVENOUS | Status: AC
  Administered 2017-01-29: 1000 mL via INTRAVENOUS

## 2017-01-29 MED ORDER — TAMSULOSIN HCL 0.4 MG PO CAPS: 0.4000 mg | ORAL_CAPSULE | Freq: Two times a day (BID) | ORAL | 0 refills | Status: DC

## 2017-01-29 MED ORDER — HYDROCODONE-ACETAMINOPHEN 5-325 MG PO TABS: 1.0000 | ORAL_TABLET | Freq: Four times a day (QID) | ORAL | 0 refills | Status: DC | PRN

## 2017-01-29 MED ORDER — ONDANSETRON 4 MG PO TBDP
4.0000 mg | ORAL_TABLET | Freq: Once | ORAL | Status: AC | PRN
  Administered 2017-01-29: 4 mg via ORAL
  Filled 2017-01-29: qty 1

## 2017-01-29 MED ORDER — NAPROXEN 250 MG PO TABS: 250.0000 mg | ORAL_TABLET | Freq: Two times a day (BID) | ORAL | 0 refills | Status: DC

## 2017-01-29 MED ORDER — ONDANSETRON 4 MG PO TBDP: 4.0000 mg | ORAL_TABLET | Freq: Three times a day (TID) | ORAL | 0 refills | Status: DC | PRN

## 2017-01-29 MED ORDER — MORPHINE SULFATE (PF) 4 MG/ML IV SOLN
4.0000 mg | Freq: Once | INTRAVENOUS | Status: AC
Start: 2017-01-29 — End: 2017-01-29
  Administered 2017-01-29: 4 mg via INTRAVENOUS
  Filled 2017-01-29: qty 1

## 2017-01-29 MED ORDER — ONDANSETRON HCL 4 MG/2ML IJ SOLN
4.0000 mg | Freq: Once | INTRAMUSCULAR | Status: AC
  Administered 2017-01-29: 4 mg via INTRAVENOUS
  Filled 2017-01-29: qty 2

## 2017-01-29 NOTE — ED Notes (Addendum)
Bladder scan results: 0 mL 

## 2017-01-29 NOTE — ED Notes (Signed)
No respiratory or acute distress noted alert and oriented x 3 no reaction to medication noted call light in reach. 

## 2017-01-29 NOTE — ED Provider Notes (Signed)
[facility]-EMERGENCY DEPT Provider Note   CSN: [id number] Arrival date & time: 01/29/17  0006    By signing my name below, I, Valentino Saxon, attest that this documentation has been prepared under the direction and in the presence of Everlene Farrier, [place]. Electronically Signed: Valentino Saxon, ED Scribe. 01/29/17. 1:50 AM.  History   Chief Complaint Chief Complaint  Patient presents with  . Flank Pain    left  . Emesis   The history is provided by the patient. No language interpreter was used.  Emesis   Pertinent negatives include no abdominal pain, no chills, no cough, no diarrhea, no fever and no headaches.   HPI Comments: Darryl Chavez is a 31 y.o. male with PMHx of renal calculus who presents to the Emergency Department complaining of acute onset, intermittent, left flank pain that occurred ~11:30pm. He reports associated nausea and episodic vomiting. Pt notes having 3-4 episodes of emesis in the ED parking lot PTA. He reports taking Zofran followed by Oxycodone (5mg ) with minimal relief. He states his pain today is similar to his hx of kidney stones. Pt denies fever, Dysuria, hematuria, trouble urinating, cough, SOB and diarrhea.   Past Medical History:  Diagnosis Date  . Renal calculus    left pelvic    There are no active problems to display for this patient.   Past Surgical History:  Procedure Laterality Date  . lithotrispy  2007  . NEPHROLITHOTOMY  05/07/2012   Procedure: NEPHROLITHOTOMY PERCUTANEOUS;  Surgeon: Lindaann Slough, MD;  Location: [facility];  Service: Urology;  Laterality: Left;       Home Medications    Prior to Admission medications   Medication Sig Start Date End Date Taking? Authorizing Provider  HYDROcodone-acetaminophen (NORCO/VICODIN) 5-325 MG tablet Take 1-2 tablets by mouth every 6 (six) hours as needed for moderate pain or severe pain. 01/29/17   Everlene Farrier, PA-C  naproxen (NAPROSYN) 250 MG tablet Take 1 tablet (250 mg total) by mouth 2  (two) times daily with a meal. 01/29/17   Everlene Farrier, PA-C  ondansetron (ZOFRAN ODT) 4 MG disintegrating tablet Take 1 tablet (4 mg total) by mouth every 8 (eight) hours as needed for nausea or vomiting. 01/29/17   Everlene Farrier, PA-C  promethazine (PHENERGAN) 25 MG tablet Take 1 tablet (25 mg total) by mouth every 6 (six) hours as needed for nausea or vomiting. Patient not taking: Reported on 04/14/2015 05/05/14   Arthor Captain, PA-C  tamsulosin (FLOMAX) 0.4 MG CAPS capsule Take 1 capsule (0.4 mg total) by mouth 2 (two) times daily. 01/29/17   Everlene Farrier, PA-C    Family History No family history on file.  Social History Social History  Substance Use Topics  . Smoking status: Never Smoker  . Smokeless tobacco: Never Used  . Alcohol use No     Allergies   Patient has no known allergies.   Review of Systems Review of Systems  Constitutional: Negative for chills and fever.  HENT: Negative for sore throat.   Eyes: Negative for visual disturbance.  Respiratory: Negative for cough and shortness of breath.   Cardiovascular: Negative for chest pain.  Gastrointestinal: Positive for nausea and vomiting. Negative for abdominal pain and diarrhea.  Genitourinary: Positive for flank pain (left). Negative for decreased urine volume, difficulty urinating, dysuria, frequency, hematuria, penile pain, testicular pain and urgency.  Musculoskeletal: Negative for back pain and neck pain.  Skin: Negative for rash.  Neurological: Negative for headaches.     Physical Exam Updated Vital Signs  BP 136/81 (BP Location: Left Arm)   Pulse (!) 56   Temp 97.7 F (36.5 C) (Oral)   Resp 18   Ht 6' (1.829 m)   Wt 68 kg   SpO2 100%   BMI 20.34 kg/m   Physical Exam  Constitutional: He appears well-developed and well-nourished. No distress.  Nontoxic appearing.   HENT:  Head: Normocephalic and atraumatic.  Mouth/Throat: Oropharynx is clear and moist.  Eyes: Conjunctivae are normal. Pupils are  equal, round, and reactive to light. Right eye exhibits no discharge. Left eye exhibits no discharge.  Neck: Neck supple.  Cardiovascular: Normal rate, regular rhythm, normal heart sounds and intact distal pulses.  Exam reveals no gallop and no friction rub.   No murmur heard. Pulmonary/Chest: Effort normal and breath sounds normal. No respiratory distress. He has no wheezes. He has no rales.  Abdominal: Soft. Bowel sounds are normal. He exhibits no distension and no mass. There is tenderness. There is no guarding.  Abdomen is soft. Bowel sounds present. Tenderness to left flank on palpation. No peritoneal signs.   Musculoskeletal: He exhibits no edema.  Lymphadenopathy:    He has no cervical adenopathy.  Neurological: He is alert. Coordination normal.  Skin: Skin is warm and dry. Capillary refill takes less than 2 seconds. No rash noted. He is not diaphoretic. No erythema. No pallor.  Psychiatric: He has a normal mood and affect. His behavior is normal.  Nursing note and vitals reviewed.    ED Treatments / Results   DIAGNOSTIC STUDIES: Oxygen Saturation is 100% on RA, normal by my interpretation.    COORDINATION OF CARE: 1:40 AM Discussed treatment plan with pt at bedside which includes imaging and pt agreed to plan.   Labs (all labs ordered are listed, but only abnormal results are displayed) Labs Reviewed  URINALYSIS, ROUTINE W REFLEX MICROSCOPIC - Abnormal; Notable for the following:       Result Value   Hgb urine dipstick MODERATE (*)    Ketones, ur 5 (*)    Protein, ur 30 (*)    Squamous Epithelial / LPF 0-5 (*)    All other components within normal limits  COMPREHENSIVE METABOLIC PANEL - Abnormal; Notable for the following:    Glucose, Bld 120 (*)    Albumin 5.1 (*)    ALT 14 (*)    Total Bilirubin 2.4 (*)    All other components within normal limits  CBC WITH DIFFERENTIAL/PLATELET - Abnormal; Notable for the following:    WBC 10.7 (*)    Neutro Abs 8.1 (*)    All  other components within normal limits    EKG  EKG Interpretation None       Radiology Dg Abdomen 1 View  Result Date: 01/29/2017 CLINICAL DATA:  31 year old male with left flank pain and nausea. EXAM: ABDOMEN - 1 VIEW COMPARISON:  Renal ultrasound dated 01/29/2017 FINDINGS: There is a 2 mm calcific density over the right renal silhouette. A 6 mm radiopaque focus adjacent to the left transverse process of the L3 vertebra may be artifactual or less likely represent a left UPJ stone. No bowel dilatation or evidence of obstruction. The osseous structures and soft tissues appear unremarkable. IMPRESSION: 1. Artifact versus less likely a 6 mm left UPJ stone. 2. A 2 mm probable right renal calculus. Electronically Signed   By: Elgie Collard M.D.   On: 01/29/2017 04:14   US Renal  Result Date: 01/29/2017 CLINICAL DATA:  Initial evaluation for acute left flank pain.  EXAM: RENAL / URINARY TRACT ULTRASOUND COMPLETE COMPARISON:  Comparison made with prior renal ultrasound from 11/19/2016. FINDINGS: Right Kidney: Length: 11.5 cm. Echogenicity within normal limits. No mass or hydronephrosis visualized. Shadowing echogenic stone measuring 3.8 mm within the interpolar aspect of the right kidney. Left Kidney: Length: 11.7 cm. Echogenicity within normal limits. Mild hydronephrosis. Echogenic calculi present, largest of which measured 6.5 mm within the lower pole. Bladder: Appears normal for degree of bladder distention. Ureteral jets not assessed on this exam. IMPRESSION: 1. Mild left hydronephrosis, suspicious for possible distal obstructive stone given the history of acute left flank pain. Ureteral jets were not assessed on this exam. 2. Bilateral nonobstructive nephrolithiasis as above. Electronically Signed   By: Rise MuBenjamin  McClintock M.D.   On: 01/29/2017 03:36    Procedures Procedures (including critical care time)  Medications Ordered in ED Medications  ondansetron (ZOFRAN-ODT) disintegrating tablet  4 mg (4 mg Oral Given 01/29/17 0025)  sodium chloride 0.9 % bolus 1,000 mL (0 mLs Intravenous Stopped 01/29/17 0228)  morphine 4 MG/ML injection 4 mg (4 mg Intravenous Given 01/29/17 0155)  ondansetron (ZOFRAN) injection 4 mg (4 mg Intravenous Given 01/29/17 0223)  ketorolac (TORADOL) 30 MG/ML injection 30 mg (30 mg Intravenous Given 01/29/17 0248)  sodium chloride 0.9 % bolus 1,000 mL (0 mLs Intravenous Stopped 01/29/17 0418)     Initial Impression / Assessment and Plan / ED Course  I have reviewed the triage vital signs and the nursing notes.  Pertinent labs & imaging results that were available during my care of the patient were reviewed by me and considered in my medical decision making (see chart for details).    This is a 31 y.o. male with PMHx of renal calculus who presents to the Emergency Department complaining of acute onset, intermittent, left flank pain that occurred ~11:30pm. He reports associated nausea and episodic vomiting. Pt notes having 3-4 episodes of emesis in the ED parking lot PTA. He reports taking Zofran followed by Oxycodone (5mg ) with minimal relief. He states his pain today is similar to his hx of kidney stones. On chart review patient with previous renal stones on CT scan. On my examination the patient is afebrile and nontoxic appearing. His abdomen is soft and he has left flank tenderness to palpation. Urinalysis is without signs of infection. Creatinine is 1.20 with a GFR greater than 60. CBC is remarkable for a white count of 10,700. Renal ultrasound shows mild left hydronephrosis suspicious for possible distal obstructive stone. Ureteral jets were not assessed on this exam. KUB reveals a likely 6 mm left UPJ stone. Patient's pain was controlled after fluid bolus, Zofran, morphine and Toradol. He is urinating without difficulty. He is tolerating by mouth. He tells me his pain is completely resolved and he is ready for discharge. We'll discharge with her surgeons for  Flomax, Zofran and Norco. The [place] was [place] the [place] controlled substance database and patient has no recent prescriptions for narcotics. I advised the patient to follow-up with their primary care provider this week. I advised the patient to return to the emergency department with new or worsening symptoms or new concerns. The patient verbalized understanding and agreement with plan.      Final Clinical Impressions(s) / ED Diagnoses   Final diagnoses:  Kidney stone    New Prescriptions New Prescriptions   HYDROCODONE-ACETAMINOPHEN (NORCO/VICODIN) 5-325 MG TABLET    Take 1-2 tablets by mouth every 6 (six) hours as needed for moderate pain or severe  pain.   NAPROXEN (NAPROSYN) 250 MG TABLET    Take 1 tablet (250 mg total) by mouth 2 (two) times daily with a meal.   ONDANSETRON (ZOFRAN ODT) 4 MG DISINTEGRATING TABLET    Take 1 tablet (4 mg total) by mouth every 8 (eight) hours as needed for nausea or vomiting.   TAMSULOSIN (FLOMAX) 0.4 MG CAPS CAPSULE    Take 1 capsule (0.4 mg total) by mouth 2 (two) times daily.    I personally performed the services described in this documentation, which was scribed in my presence. The recorded information has been reviewed and is accurate.       Everlene Farrier, PA-C 01/29/17 1610    Shon Baton, MD 01/29/17 [phone]

## 2017-01-29 NOTE — ED Triage Notes (Signed)
Pt from home with complaints of left flank pain. Pt has hx of kidney stones. Pt states this feels worse than his normal kidney stones. Pt states this began about 2 hours ago. Pt states he has had 4 episodes of emesis in that span. Pt is not febrile nor tachycardic.

## 2017-11-17 ENCOUNTER — Encounter (HOSPITAL_COMMUNITY): Payer: Self-pay | Admitting: Emergency Medicine

## 2017-11-17 ENCOUNTER — Emergency Department (HOSPITAL_COMMUNITY)
Admission: EM | Admit: 2017-11-17 | Discharge: 2017-11-17 | Disposition: A | Discharge status: Home / Self Care | Payer: BLUE CROSS/BLUE SHIELD | Attending: Emergency Medicine | Admitting: Emergency Medicine

## 2017-11-17 ENCOUNTER — Other Ambulatory Visit: Payer: Self-pay

## 2017-11-17 ENCOUNTER — Emergency Department (HOSPITAL_COMMUNITY): Payer: BLUE CROSS/BLUE SHIELD

## 2017-11-17 DIAGNOSIS — E876 Hypokalemia: Secondary | ICD-10-CM | POA: Insufficient documentation

## 2017-11-17 DIAGNOSIS — R109 Unspecified abdominal pain: Secondary | ICD-10-CM

## 2017-11-17 DIAGNOSIS — R112 Nausea with vomiting, unspecified: Secondary | ICD-10-CM | POA: Insufficient documentation

## 2017-11-17 DIAGNOSIS — Z79899 Other long term (current) drug therapy: Secondary | ICD-10-CM | POA: Insufficient documentation

## 2017-11-17 DIAGNOSIS — R1012 Left upper quadrant pain: Secondary | ICD-10-CM | POA: Insufficient documentation

## 2017-11-17 LAB — CBC WITH DIFFERENTIAL/PLATELET
BASOS PCT: 0 %
Basophils Absolute: 0 10*3/uL (ref 0.0–0.1)
EOS ABS: 0.1 10*3/uL (ref 0.0–0.7)
Eosinophils Relative: 2 %
HEMATOCRIT: 42.6 % (ref 39.0–52.0)
HEMOGLOBIN: 14.5 g/dL (ref 13.0–17.0)
LYMPHS ABS: 1.5 10*3/uL (ref 0.7–4.0)
Lymphocytes Relative: 23 %
MCH: 30.7 pg (ref 26.0–34.0)
MCHC: 34 g/dL (ref 30.0–36.0)
MCV: 90.1 fL (ref 78.0–100.0)
MONOS PCT: 9 %
Monocytes Absolute: 0.6 10*3/uL (ref 0.1–1.0)
NEUTROS ABS: 4.2 10*3/uL (ref 1.7–7.7)
NEUTROS PCT: 66 %
Platelets: 126 10*3/uL — ABNORMAL LOW (ref 150–400)
RBC: 4.73 MIL/uL (ref 4.22–5.81)
RDW: 13 % (ref 11.5–15.5)
WBC: 6.4 10*3/uL (ref 4.0–10.5)

## 2017-11-17 LAB — URINALYSIS, ROUTINE W REFLEX MICROSCOPIC
BILIRUBIN URINE: NEGATIVE
Bacteria, UA: NONE SEEN
GLUCOSE, UA: NEGATIVE mg/dL
HGB URINE DIPSTICK: NEGATIVE
KETONES UR: 5 mg/dL — AB
NITRITE: NEGATIVE
PH: 5 (ref 5.0–8.0)
Protein, ur: NEGATIVE mg/dL
SPECIFIC GRAVITY, URINE: 1.026 (ref 1.005–1.030)
Squamous Epithelial / LPF: NONE SEEN

## 2017-11-17 LAB — BASIC METABOLIC PANEL
Anion gap: 8 (ref 5–15)
BUN: 14 mg/dL (ref 6–20)
CHLORIDE: 107 mmol/L (ref 101–111)
CO2: 24 mmol/L (ref 22–32)
CREATININE: 1.07 mg/dL (ref 0.61–1.24)
Calcium: 9.3 mg/dL (ref 8.9–10.3)
GFR calc non Af Amer: >60 mL/min (ref >60)
Glucose, Bld: 113 mg/dL — ABNORMAL HIGH (ref 65–99)
POTASSIUM: 3.4 mmol/L — AB (ref 3.5–5.1)
SODIUM: 139 mmol/L (ref 135–145)

## 2017-11-17 LAB — I-STAT CG4 LACTIC ACID, ED: LACTIC ACID, VENOUS: 1.32 mmol/L (ref 0.5–1.9)

## 2017-11-17 MED ORDER — MORPHINE SULFATE (PF) 4 MG/ML IV SOLN
4.0000 mg | Freq: Once | INTRAVENOUS | Status: AC
  Administered 2017-11-17: 4 mg via INTRAVENOUS
  Filled 2017-11-17: qty 1

## 2017-11-17 MED ORDER — POTASSIUM CHLORIDE CRYS ER 20 MEQ PO TBCR
40.0000 meq | EXTENDED_RELEASE_TABLET | Freq: Once | ORAL | Status: AC
  Administered 2017-11-17: 40 meq via ORAL
  Filled 2017-11-17: qty 2

## 2017-11-17 MED ORDER — METOCLOPRAMIDE HCL 5 MG/ML IJ SOLN
10.0000 mg | Freq: Once | INTRAMUSCULAR | Status: AC
  Administered 2017-11-17: 10 mg via INTRAVENOUS
  Filled 2017-11-17: qty 2

## 2017-11-17 NOTE — Discharge Instructions (Signed)
Take Tylenol or Advil as directed for pain.  Call the number on these discharge instructions to get a primary care physician.  Return to the emergency department if concern for any reason

## 2017-11-17 NOTE — ED Provider Notes (Signed)
[facility]-EMERGENCY DEPT Provider Note   CSN: [id number] Arrival date & time: 11/17/17  0606     History   Chief Complaint Chief Complaint  Patient presents with  . Flank Pain    HPI Darryl Chavez is a 31 y.o. male.  Signs of left flank pain nonradiating onset 4 AM today typical of kidney stone pain he has had in the past he is vomited 3 times since onset of pain presently complains of nausea.  Nothing makes pain better or worse.  No treatment prior to coming here no fever.  Last bowel movement yesterday normal.  No other associated symptoms  HPI  Past Medical History:  Diagnosis Date  . Renal calculus    left pelvic    There are no active problems to display for this patient.   Past Surgical History:  Procedure Laterality Date  . lithotrispy  2007  . NEPHROLITHOTOMY  05/07/2012   Procedure: NEPHROLITHOTOMY PERCUTANEOUS;  Surgeon: Lindaann Slough, MD;  Location: [facility];  Service: Urology;  Laterality: Left;       Home Medications    Prior to Admission medications   Medication Sig Start Date End Date Taking? Authorizing Provider  HYDROcodone-acetaminophen (NORCO/VICODIN) 5-325 MG tablet Take 1-2 tablets by mouth every 6 (six) hours as needed for moderate pain or severe pain. 01/29/17   Everlene Farrier, PA-C  naproxen (NAPROSYN) 250 MG tablet Take 1 tablet (250 mg total) by mouth 2 (two) times daily with a meal. 01/29/17   Everlene Farrier, PA-C  ondansetron (ZOFRAN ODT) 4 MG disintegrating tablet Take 1 tablet (4 mg total) by mouth every 8 (eight) hours as needed for nausea or vomiting. 01/29/17   Everlene Farrier, PA-C  promethazine (PHENERGAN) 25 MG tablet Take 1 tablet (25 mg total) by mouth every 6 (six) hours as needed for nausea or vomiting. Patient not taking: Reported on 04/14/2015 05/05/14   Arthor Captain, PA-C  tamsulosin (FLOMAX) 0.4 MG CAPS capsule Take 1 capsule (0.4 mg total) by mouth 2 (two) times daily. 01/29/17   Everlene Farrier, PA-C     Family History No family history on file.  Social History Social History   Tobacco Use  . Smoking status: Never Smoker  . Smokeless tobacco: Never Used  Substance Use Topics  . Alcohol use: No  . Drug use: No    Comment: marijuana 1-2  times week, stopped 2 weeks ago     Allergies   Patient has no known allergies.   Review of Systems Review of Systems  Constitutional: Negative.   HENT: Negative.   Respiratory: Negative.   Cardiovascular: Negative.   Gastrointestinal: Positive for nausea and vomiting.  Genitourinary: Positive for flank pain.  Skin: Negative.   Neurological: Negative.   Psychiatric/Behavioral: Negative.   All other systems reviewed and are negative.    Physical Exam Updated Vital Signs BP (!) 127/92 (BP Location: Right Arm)   Pulse (!) 101   Temp 98 F (36.7 C) (Oral)   Resp 18   SpO2 97%   Physical Exam  Constitutional: He appears well-developed and well-nourished. No distress.  HENT:  Head: Normocephalic and atraumatic.  Eyes: Conjunctivae are normal. Pupils are equal, round, and reactive to light.  Neck: Neck supple. No tracheal deviation present. No thyromegaly present.  Cardiovascular: Normal rate and regular rhythm.  No murmur heard. Pulmonary/Chest: Effort normal and breath sounds normal.  Abdominal: Soft. Bowel sounds are normal. He exhibits no distension. There is no tenderness.  Genitourinary: Penis normal.  Genitourinary Comments: Scrotum normal mild left flank tenderness  Musculoskeletal: Normal range of motion. He exhibits no edema or tenderness.  Neurological: He is alert. Coordination normal.  Skin: Skin is warm and dry. No rash noted.  Psychiatric: He has a normal mood and affect.  Nursing note and vitals reviewed.    ED Treatments / Results  Labs (all labs ordered are listed, but only abnormal results are displayed) Labs Reviewed  CBC WITH DIFFERENTIAL/PLATELET  BASIC METABOLIC PANEL  URINALYSIS, ROUTINE W  REFLEX MICROSCOPIC  I-STAT CG4 LACTIC ACID, ED  I-STAT CHEM 8, ED    EKG  EKG Interpretation None       Radiology No results found.  Procedures Procedures (including critical care time)  Medications Ordered in ED Medications  morphine 4 MG/ML injection 4 mg (not administered)  metoCLOPramide (REGLAN) injection 10 mg (not administered)   Results for orders placed or performed during the hospital encounter of 11/17/17  CBC with Differential  Result Value Ref Range   WBC 6.4 4.0 - 10.5 K/uL   RBC 4.73 4.22 - 5.81 MIL/uL   Hemoglobin 14.5 13.0 - 17.0 g/dL   HCT 16.142.6 09.639.0 - 04.552.0 %   MCV 90.1 78.0 - 100.0 fL   MCH 30.7 26.0 - 34.0 pg   MCHC 34.0 30.0 - 36.0 g/dL   RDW 40.913.0 81.111.5 - 91.415.5 %   Platelets 126 (L) 150 - 400 K/uL   Neutrophils Relative % 66 %   Neutro Abs 4.2 1.7 - 7.7 K/uL   Lymphocytes Relative 23 %   Lymphs Abs 1.5 0.7 - 4.0 K/uL   Monocytes Relative 9 %   Monocytes Absolute 0.6 0.1 - 1.0 K/uL   Eosinophils Relative 2 %   Eosinophils Absolute 0.1 0.0 - 0.7 K/uL   Basophils Relative 0 %   Basophils Absolute 0.0 0.0 - 0.1 K/uL  Basic metabolic panel  Result Value Ref Range   Sodium 139 135 - 145 mmol/L   Potassium 3.4 (L) 3.5 - 5.1 mmol/L   Chloride 107 101 - 111 mmol/L   CO2 24 22 - 32 mmol/L   Glucose, Bld 113 (H) 65 - 99 mg/dL   BUN 14 6 - 20 mg/dL   Creatinine, Ser 7.821.07 0.61 - 1.24 mg/dL   Calcium 9.3 8.9 - 95.610.3 mg/dL   GFR calc non Af Amer >60 >60 mL/min   GFR calc Af Amer >60 >60 mL/min   Anion gap 8 5 - 15  Urinalysis, Routine w reflex microscopic  Result Value Ref Range   Color, Urine YELLOW YELLOW   APPearance CLEAR CLEAR   Specific Gravity, Urine 1.026 1.005 - 1.030   pH 5.0 5.0 - 8.0   Glucose, UA NEGATIVE NEGATIVE mg/dL   Hgb urine dipstick NEGATIVE NEGATIVE   Bilirubin Urine NEGATIVE NEGATIVE   Ketones, ur 5 (A) NEGATIVE mg/dL   Protein, ur NEGATIVE NEGATIVE mg/dL   Nitrite NEGATIVE NEGATIVE   Leukocytes, UA TRACE (A) NEGATIVE    RBC / HPF 0-5 0 - 5 RBC/hpf   WBC, UA 0-5 0 - 5 WBC/hpf   Bacteria, UA NONE SEEN NONE SEEN   Squamous Epithelial / LPF NONE SEEN NONE SEEN   Mucus PRESENT    Ca Oxalate Crys, UA PRESENT   I-Stat CG4 Lactic Acid, ED  Result Value Ref Range   Lactic Acid, Venous 1.32 0.5 - 1.9 mmol/L   [place] Renal  Result Date: 11/17/2017 CLINICAL DATA:  Left flank pain since 4 a.m. this morning.  Nausea vomiting. History of nephrolithiasis. EXAM: RENAL / URINARY TRACT ULTRASOUND COMPLETE COMPARISON:  Renal ultrasound 01/29/2017 FINDINGS: Right Kidney: Length: 11.5 cm. Normal parenchymal echogenicity. No renal masses. 5 mm echogenic focus in the midpole consistent with intrarenal stone. This was present on the prior ultrasound. No other convincing stones. No hydronephrosis. Left Kidney: Length: 11.3 cm. Normal parenchymal echogenicity. No renal masses. 6 mm lower pole echogenic focus consistent with a stone, stable from the prior exam. No other convincing stones. No hydronephrosis. Go Bladder: Bladder is decompressed. IMPRESSION: 1. No acute findings. No hydronephrosis to suggest a ureteral stone. 2. Single intrarenal stones noted in each kidney, stable from the prior ultrasound. Electronically Signed   By: Amie Portlandavid  Ormond M.D.   On: 11/17/2017 08:47    Initial Impression / Assessment and Plan / ED Course  I have reviewed the triage vital signs and the nursing notes.  Pertinent labs & imaging results that were available during my care of the patient were reviewed by me and considered in my medical decision making (see chart for details).     9:40 PM patient asymptomatic pain-free after treatment with intravenous antiemetics and opioids.  He will receive potassium chloride 40 mg p.o. prior to discharge.  Able to drink without nausea or vomiting Do not feel that further imaging is needed in light of patient's being asymptomatic.  Referral primary care Tylenol or Advil as needed for pain.  Final Clinical Impressions(s)  / ED Diagnoses  Dx#1 left flank pain #2 nausea and vomiting #3 hypokalemia Final diagnoses:  None    ED Discharge Orders    None       Doug SouJacubowitz, Demontrae Gilbert, MD 11/17/17 [phone]

## 2017-11-17 NOTE — ED Triage Notes (Signed)
Pt arriving from home with complaints of left sided flank pain that began around 4 am. Pt has not taken anything for pain prior to arrival. Pt reports episodes of emesis no diarrhea. Pt has hx of kidney stones.

## 2022-05-11 ENCOUNTER — Ambulatory Visit (INDEPENDENT_AMBULATORY_CARE_PROVIDER_SITE_OTHER): Payer: BLUE CROSS/BLUE SHIELD | Admitting: Podiatry

## 2022-05-11 ENCOUNTER — Encounter: Payer: Self-pay | Admitting: Podiatry

## 2022-05-11 DIAGNOSIS — L603 Nail dystrophy: Secondary | ICD-10-CM | POA: Diagnosis not present

## 2022-05-11 DIAGNOSIS — L600 Ingrowing nail: Secondary | ICD-10-CM

## 2022-05-11 NOTE — Patient Instructions (Signed)

## 2022-05-11 NOTE — Telephone Encounter (Signed)
Please advise 

## 2022-05-13 NOTE — Progress Notes (Signed)
Subjective:   Patient ID: Darryl Chavez, male   DOB: 36 y.o.   MRN: [id number]   HPI 36 year old male presents the office today with concerns of his right big toenail becoming thickened discolored and he wants to consider having the nail removed.  This started in 2015.  States the nail has previously come off after he hit it and it grew back and thickened discolored.  No drainage or pus coming from the nail.  No other concerns.   Review of Systems  All other systems reviewed and are negative.  Past Medical History:  Diagnosis Date   Renal calculus    left pelvic    Past Surgical History:  Procedure Laterality Date   lithotrispy  2007   NEPHROLITHOTOMY  05/07/2012   Procedure: NEPHROLITHOTOMY PERCUTANEOUS;  Surgeon: Lindaann Slough, MD;  Location: [facility];  Service: Urology;  Laterality: Left;    No current outpatient medications on file.  No Known Allergies        Objective:  Physical Exam  General: AAO x3, NAD  Dermatological: Right hallux nail is hypertrophic, dystrophic with yellow, brown discoloration is quite dystrophic.  Incurvation of the nail borders.  No edema, erythema toenail sites.  No open lesions.  Vascular: Dorsalis Pedis artery and Posterior Tibial artery pedal pulses are 2/4 bilateral with immedate capillary fill time.  There is no pain with calf compression, swelling, warmth, erythema.   Neruologic: Grossly intact via light touch bilateral.  Musculoskeletal:Muscular strength 5/5 in all groups tested bilateral.  Gait: Unassisted, Nonantalgic.       Assessment:   Significant onychodystrophy, ingrown toenail     Plan:  -Treatment options discussed including all alternatives, risks, and complications -Etiology of symptoms were discussed -I discussed total nail removal.  We discussed either permanently early and grow back in.  He wants to have the nail removed and will not grow back in but it starts to grow when it comes in thick or discolored we can  start medication to help treat this.  He is aware that there is a higher chance that the nail could come back in the same way if it were to come in the same way then remove it permanently.  He does not want to proceed with that at this time and he wants to reschedule have the nail removed.  I will reappointment his convenience for nail removal.  Discussed the procedure as well as postoperative course today.  Vivi Barrack DPM

## 2022-05-26 ENCOUNTER — Ambulatory Visit (INDEPENDENT_AMBULATORY_CARE_PROVIDER_SITE_OTHER): Payer: BLUE CROSS/BLUE SHIELD | Admitting: Podiatry

## 2022-05-26 DIAGNOSIS — L603 Nail dystrophy: Secondary | ICD-10-CM | POA: Diagnosis not present

## 2022-05-26 DIAGNOSIS — L600 Ingrowing nail: Secondary | ICD-10-CM

## 2022-05-26 NOTE — Patient Instructions (Signed)

## 2022-05-28 NOTE — Progress Notes (Signed)
Subjective: 36 year old male presents the office today to have his right big toenail removed given thickened and discoloration of the nail.  Discussed discomfort at times.  No swelling redness or any drainage.  No other concerns.   Objective: AAO x3, NAD DP/PT pulses palpable bilaterally, CRT less than 3 seconds Exam is unchanged.  Right hallux toenail is significantly hypertrophic, dystrophic nail discoloration.  Incurvation of the nail borders.  There is no edema, erythema or signs of infection of the toenail sites.   No pain with calf compression, swelling, warmth, erythema  Assessment: Significant onychodystrophy, onychomycosis  Plan: -All treatment options discussed with the patient including all alternatives, risks, complications.  -We discussed no removal probably versus temporary.  He wants a trial of the nail grow back to see if it comes in better.  We discussed watching the nails at times and we can start treatment if it is coming thick and discolored.  It comes in the same way then we will proceed with total nail avulsion with chemical matricectomy. -At this time, patient was to proceed with total nail removal without chemical matricectomy. Risks and complications were discussed with the patient for which they understand and  verbally consent to the procedure. Under sterile conditions a total of 3 mL of a mixture of 2% lidocaine plain and 0.5% Marcaine plain was infiltrated in a hallux block fashion. Once anesthetized, the skin was prepped in sterile fashion. A tourniquet was then applied. Next the right hallux nail was removed in total.  Once the nail was removed, the area was debrided and the underlying skin was intact. The area was irrigated and hemostasis was obtained.  A dry sterile dressing was applied. After application of the dressing the tourniquet was removed and there is found to be an immediate capillary refill time to the digit. The patient tolerated the procedure well any  complications. Post procedure instructions were discussed the patient for which he verbally understood. Discussed signs/symptoms of worsening infection and directed to call the office immediately should any occur or go directly to the emergency room. In the meantime, encouraged to call the office with any questions, concerns, changes symptoms. -Patient encouraged to call the office with any questions, concerns, change in symptoms.   Vivi Barrack DPM

## 2022-06-05 ENCOUNTER — Encounter: Payer: Self-pay | Admitting: Podiatry

## 2022-06-07 ENCOUNTER — Other Ambulatory Visit: Payer: BLUE CROSS/BLUE SHIELD

## 2022-06-09 ENCOUNTER — Ambulatory Visit: Payer: BLUE CROSS/BLUE SHIELD | Admitting: Podiatry

## 2022-06-18 ENCOUNTER — Encounter: Payer: Self-pay | Admitting: Podiatry

## 2022-06-22 ENCOUNTER — Ambulatory Visit (INDEPENDENT_AMBULATORY_CARE_PROVIDER_SITE_OTHER): Payer: BLUE CROSS/BLUE SHIELD

## 2022-06-22 DIAGNOSIS — L603 Nail dystrophy: Secondary | ICD-10-CM

## 2022-06-22 NOTE — Progress Notes (Signed)
Patient in office for nail check after removal of the right big toenail.   Patient denies nausea, vomiting, fever and chills. No concerns were voiced from patient.    Right hallux appears to be free from infection and has healed well.    Advised patient he can discontinue epsom salt soaks at this and if any questions, comments or concerns arise to call the office.   Patient verbalized understanding. All questions were answered during this visit.

## 2022-11-06 ENCOUNTER — Encounter: Payer: Self-pay | Admitting: Podiatry

## 2022-11-06 NOTE — Telephone Encounter (Signed)
Can you please schedule a follow-up? Thanks!

## 2022-11-07 NOTE — Telephone Encounter (Signed)
Noted, thanks!

## 2022-11-09 ENCOUNTER — Ambulatory Visit (INDEPENDENT_AMBULATORY_CARE_PROVIDER_SITE_OTHER): Payer: BC Managed Care – PPO

## 2022-11-09 ENCOUNTER — Ambulatory Visit (INDEPENDENT_AMBULATORY_CARE_PROVIDER_SITE_OTHER): Payer: BC Managed Care – PPO | Admitting: Podiatry

## 2022-11-09 DIAGNOSIS — M7751 Other enthesopathy of right foot: Secondary | ICD-10-CM

## 2022-11-09 DIAGNOSIS — S99921A Unspecified injury of right foot, initial encounter: Secondary | ICD-10-CM

## 2022-11-09 DIAGNOSIS — L603 Nail dystrophy: Secondary | ICD-10-CM | POA: Diagnosis not present

## 2022-11-09 NOTE — Patient Instructions (Signed)
Start with a BIOTIN supplement or a vitamin for HAIR, SKIN, NAILS  I have ordered a medication for you that will come from [facility] [place] in [place]. They should be calling you to verify insurance and will mail the medication to you. If you live close by then you can go by their pharmacy to pick up the medication. Their phone number is [phone]. If you do not hear from them in the next few days, please give [place] a call at [phone].

## 2022-11-12 NOTE — Progress Notes (Signed)
Subjective: Chief Complaint  Patient presents with   Ingrown Toenail    Right foot nail check    36 year old male presents the office today for nail check.  States the nail started to grow when it is coming and thicker.  He has no pain, swelling, redness or any drainage or signs of infection he reports.  No recent treatment.  Objective: AAO x3, NAD DP/PT pulses palpable bilaterally, CRT less than 3 seconds The nail is starting to grow and hypertrophic, dystrophic with some mild brown discoloration.  There is no edema, erythema or signs of infection but there is no open lesions. No pain with calf compression, swelling, warmth, erythema  Assessment: 36 year old male with onychodystrophy, small subungual exostosis  Plan: -All treatment options discussed with the patient including all alternatives, risks, complications.  -X-rays obtained reviewed.  3 views were obtained.  There is small amount of spurring present distal phalanx. -Although he is cut the bone spur the nail has not grown out that much yet I do not think it is affecting the nail.  If needed discussed exostectomy at the toe.  However in the meantime we will order a compound cream through [facility] to help with nail fungus, dystrophy of the nail as includes urea as well as antifungal medication.  Will continue to watch the nail as it grows out but hopefully this will be helpful.  Also has biotin supplements or vitamins to help with the hair, skin and nails.  Vivi Barrack DPM

## 2022-12-07 ENCOUNTER — Encounter: Payer: Self-pay | Admitting: Podiatry

## 2022-12-08 NOTE — Telephone Encounter (Signed)
Form is in your folder.

## 2022-12-08 NOTE — Telephone Encounter (Signed)
Can we re-fax the compound medication for nail fungus? I am not in the office today. If needed I can do it Tuesday, just put it in my box with his name on it so I don't forget! Thank you.
